# Patient Record
Sex: Male | Born: 1975
Health system: Southern US, Community
[De-identification: ages and names within clinical notes are randomized; demographics above are authoritative.]

## PROBLEM LIST (undated history)

## (undated) DIAGNOSIS — K589 Irritable bowel syndrome without diarrhea: Secondary | ICD-10-CM

## (undated) DIAGNOSIS — K219 Gastro-esophageal reflux disease without esophagitis: Secondary | ICD-10-CM

## (undated) HISTORY — PX: CHOLECYSTECTOMY: SHX55

---

## 1998-05-24 ENCOUNTER — Emergency Department (HOSPITAL_COMMUNITY): Admission: EM | Admit: 1998-05-24 | Discharge: 1998-05-24 | Payer: Self-pay | Admitting: Emergency Medicine

## 1998-05-25 ENCOUNTER — Emergency Department (HOSPITAL_COMMUNITY): Admission: EM | Admit: 1998-05-25 | Discharge: 1998-05-25 | Payer: Self-pay | Admitting: Emergency Medicine

## 1998-11-08 ENCOUNTER — Emergency Department (HOSPITAL_COMMUNITY): Admission: EM | Admit: 1998-11-08 | Discharge: 1998-11-08 | Payer: Self-pay | Admitting: Emergency Medicine

## 1998-12-16 ENCOUNTER — Emergency Department (HOSPITAL_COMMUNITY): Admission: EM | Admit: 1998-12-16 | Discharge: 1998-12-16 | Payer: Self-pay | Admitting: Emergency Medicine

## 1999-03-26 ENCOUNTER — Emergency Department (HOSPITAL_COMMUNITY): Admission: EM | Admit: 1999-03-26 | Discharge: 1999-03-26 | Payer: Self-pay | Admitting: Internal Medicine

## 1999-05-16 ENCOUNTER — Emergency Department (HOSPITAL_COMMUNITY): Admission: EM | Admit: 1999-05-16 | Discharge: 1999-05-16 | Payer: Self-pay | Admitting: Internal Medicine

## 1999-06-08 ENCOUNTER — Emergency Department (HOSPITAL_COMMUNITY): Admission: EM | Admit: 1999-06-08 | Discharge: 1999-06-08 | Payer: Self-pay | Admitting: Emergency Medicine

## 1999-06-30 ENCOUNTER — Emergency Department (HOSPITAL_COMMUNITY): Admission: EM | Admit: 1999-06-30 | Discharge: 1999-06-30 | Payer: Self-pay | Admitting: Emergency Medicine

## 1999-12-11 ENCOUNTER — Emergency Department (HOSPITAL_COMMUNITY): Admission: EM | Admit: 1999-12-11 | Discharge: 1999-12-11 | Payer: Self-pay | Admitting: Emergency Medicine

## 2006-11-05 ENCOUNTER — Emergency Department (HOSPITAL_COMMUNITY): Admission: EM | Admit: 2006-11-05 | Discharge: 2006-11-05 | Payer: Self-pay | Admitting: Emergency Medicine

## 2007-03-09 ENCOUNTER — Emergency Department (HOSPITAL_COMMUNITY): Admission: EM | Admit: 2007-03-09 | Discharge: 2007-03-09 | Payer: Self-pay | Admitting: Emergency Medicine

## 2007-04-29 ENCOUNTER — Emergency Department (HOSPITAL_COMMUNITY): Admission: EM | Admit: 2007-04-29 | Discharge: 2007-04-30 | Payer: Self-pay | Admitting: Emergency Medicine

## 2008-03-20 ENCOUNTER — Emergency Department (HOSPITAL_COMMUNITY): Admission: EM | Admit: 2008-03-20 | Discharge: 2008-03-20 | Payer: Self-pay | Admitting: Emergency Medicine

## 2009-02-24 ENCOUNTER — Emergency Department (HOSPITAL_COMMUNITY): Admission: EM | Admit: 2009-02-24 | Discharge: 2009-02-24 | Payer: Self-pay | Admitting: Emergency Medicine

## 2009-11-20 ENCOUNTER — Emergency Department (HOSPITAL_COMMUNITY): Admission: EM | Admit: 2009-11-20 | Discharge: 2009-11-20 | Payer: Self-pay | Admitting: Emergency Medicine

## 2009-12-01 ENCOUNTER — Emergency Department (HOSPITAL_COMMUNITY): Admission: EM | Admit: 2009-12-01 | Discharge: 2009-12-01 | Payer: Self-pay | Admitting: Emergency Medicine

## 2009-12-02 ENCOUNTER — Encounter: Admission: RE | Admit: 2009-12-02 | Discharge: 2009-12-02 | Payer: Self-pay | Admitting: Physician Assistant

## 2010-01-16 ENCOUNTER — Emergency Department (HOSPITAL_COMMUNITY): Admission: EM | Admit: 2010-01-16 | Discharge: 2010-01-16 | Payer: Self-pay | Admitting: Emergency Medicine

## 2010-05-06 ENCOUNTER — Emergency Department (HOSPITAL_COMMUNITY)
Admission: EM | Admit: 2010-05-06 | Discharge: 2010-05-07 | Payer: Self-pay | Source: Home / Self Care | Admitting: Emergency Medicine

## 2010-08-12 ENCOUNTER — Emergency Department (HOSPITAL_COMMUNITY)
Admission: EM | Admit: 2010-08-12 | Discharge: 2010-08-12 | Disposition: A | Discharge status: Home / Self Care | Payer: Self-pay | Attending: Emergency Medicine | Admitting: Emergency Medicine

## 2010-08-12 DIAGNOSIS — J45909 Unspecified asthma, uncomplicated: Secondary | ICD-10-CM | POA: Insufficient documentation

## 2010-08-12 DIAGNOSIS — J029 Acute pharyngitis, unspecified: Secondary | ICD-10-CM | POA: Insufficient documentation

## 2010-08-12 DIAGNOSIS — E119 Type 2 diabetes mellitus without complications: Secondary | ICD-10-CM | POA: Insufficient documentation

## 2010-08-12 DIAGNOSIS — J3489 Other specified disorders of nose and nasal sinuses: Secondary | ICD-10-CM | POA: Insufficient documentation

## 2010-09-03 LAB — RAPID STREP SCREEN (MED CTR MEBANE ONLY): Streptococcus, Group A Screen (Direct): NEGATIVE

## 2010-09-03 LAB — POCT CARDIAC MARKERS: Troponin i, poc: <0.05 ng/mL (ref 0.00–0.09)

## 2010-09-03 LAB — POCT I-STAT, CHEM 8
BUN: 9 mg/dL (ref 6–23)
Calcium, Ion: 1.09 mmol/L — ABNORMAL LOW (ref 1.12–1.32)
Glucose, Bld: 119 mg/dL — ABNORMAL HIGH (ref 70–99)
TCO2: 26 mmol/L (ref 0–100)

## 2010-09-06 LAB — POCT I-STAT, CHEM 8
BUN: 6 mg/dL (ref 6–23)
Calcium, Ion: 1.13 mmol/L (ref 1.12–1.32)
Chloride: 104 mEq/L (ref 96–112)
HCT: 43 % (ref 39.0–52.0)
Potassium: 3.5 mEq/L (ref 3.5–5.1)

## 2010-09-06 LAB — GLUCOSE, CAPILLARY: Glucose-Capillary: 178 mg/dL — ABNORMAL HIGH (ref 70–99)

## 2010-09-08 LAB — URINALYSIS, ROUTINE W REFLEX MICROSCOPIC
Bilirubin Urine: NEGATIVE
Glucose, UA: >1000 mg/dL — AB
Hgb urine dipstick: NEGATIVE
Ketones, ur: NEGATIVE mg/dL
Leukocytes, UA: NEGATIVE
Nitrite: NEGATIVE
Protein, ur: NEGATIVE mg/dL
Specific Gravity, Urine: 1.042 — ABNORMAL HIGH (ref 1.005–1.030)
Urobilinogen, UA: 1 mg/dL (ref 0.0–1.0)
pH: 5.5 (ref 5.0–8.0)

## 2010-09-08 LAB — COMPREHENSIVE METABOLIC PANEL WITH GFR
AST: 28 U/L (ref 0–37)
BUN: 5 mg/dL — ABNORMAL LOW (ref 6–23)
CO2: 26 meq/L (ref 19–32)
Chloride: 96 meq/L (ref 96–112)
Creatinine, Ser: 0.71 mg/dL (ref 0.4–1.5)
GFR calc Af Amer: >60 mL/min (ref >60)
GFR calc non Af Amer: >60 mL/min (ref >60)
Total Bilirubin: 0.8 mg/dL (ref 0.3–1.2)

## 2010-09-08 LAB — GLUCOSE, CAPILLARY
Glucose-Capillary: 365 mg/dL — ABNORMAL HIGH (ref 70–99)
Glucose-Capillary: 377 mg/dL — ABNORMAL HIGH (ref 70–99)
Glucose-Capillary: 427 mg/dL — ABNORMAL HIGH (ref 70–99)
Glucose-Capillary: 493 mg/dL — ABNORMAL HIGH (ref 70–99)

## 2010-09-08 LAB — DIFFERENTIAL
Basophils Absolute: 0 K/uL (ref 0.0–0.1)
Basophils Relative: 0 % (ref 0–1)
Eosinophils Absolute: 0.2 10*3/uL (ref 0.0–0.7)
Eosinophils Relative: 2 % (ref 0–5)
Lymphocytes Relative: 26 % (ref 12–46)
Lymphs Abs: 3.1 10*3/uL (ref 0.7–4.0)
Monocytes Absolute: 0.9 10*3/uL (ref 0.1–1.0)
Monocytes Relative: 7 % (ref 3–12)
Neutro Abs: 7.9 10*3/uL — ABNORMAL HIGH (ref 1.7–7.7)
Neutrophils Relative %: 65 % (ref 43–77)

## 2010-09-08 LAB — COMPREHENSIVE METABOLIC PANEL
ALT: 43 U/L (ref 0–53)
Albumin: 3.5 g/dL (ref 3.5–5.2)
Alkaline Phosphatase: 121 U/L — ABNORMAL HIGH (ref 39–117)
Calcium: 9.1 mg/dL (ref 8.4–10.5)
Glucose, Bld: 454 mg/dL — ABNORMAL HIGH (ref 70–99)
Potassium: 3.7 mEq/L (ref 3.5–5.1)
Sodium: 132 mEq/L — ABNORMAL LOW (ref 135–145)
Total Protein: 7.9 g/dL (ref 6.0–8.3)

## 2010-09-08 LAB — CBC
HCT: 43 % (ref 39.0–52.0)
Hemoglobin: 14.5 g/dL (ref 13.0–17.0)
MCHC: 33.7 g/dL (ref 30.0–36.0)
MCV: 87 fL (ref 78.0–100.0)
Platelets: 282 10*3/uL (ref 150–400)
RBC: 4.94 MIL/uL (ref 4.22–5.81)
RDW: 13.9 % (ref 11.5–15.5)
WBC: 12.2 K/uL — ABNORMAL HIGH (ref 4.0–10.5)

## 2010-09-08 LAB — URINE MICROSCOPIC-ADD ON: Urine-Other: NONE SEEN

## 2010-09-26 LAB — BASIC METABOLIC PANEL
BUN: 7 mg/dL (ref 6–23)
CO2: 27 mEq/L (ref 19–32)
Calcium: 9 mg/dL (ref 8.4–10.5)
Chloride: 104 mEq/L (ref 96–112)
Creatinine, Ser: 0.76 mg/dL (ref 0.4–1.5)
GFR calc Af Amer: >60 mL/min (ref >60)
GFR calc non Af Amer: >60 mL/min (ref >60)
Glucose, Bld: 113 mg/dL — ABNORMAL HIGH (ref 70–99)
Potassium: 3.9 mEq/L (ref 3.5–5.1)
Sodium: 139 mEq/L (ref 135–145)

## 2010-09-26 LAB — CBC
HCT: 37.5 % — ABNORMAL LOW (ref 39.0–52.0)
Hemoglobin: 12.4 g/dL — ABNORMAL LOW (ref 13.0–17.0)
MCV: 87.3 fL (ref 78.0–100.0)
RBC: 4.29 MIL/uL (ref 4.22–5.81)
WBC: 11.4 10*3/uL — ABNORMAL HIGH (ref 4.0–10.5)

## 2010-09-26 LAB — URINALYSIS, ROUTINE W REFLEX MICROSCOPIC
Bilirubin Urine: NEGATIVE
Glucose, UA: NEGATIVE mg/dL
Hgb urine dipstick: NEGATIVE
Ketones, ur: NEGATIVE mg/dL
Nitrite: NEGATIVE
Protein, ur: NEGATIVE mg/dL
Specific Gravity, Urine: 1.017 (ref 1.005–1.030)
Urobilinogen, UA: 0.2 mg/dL (ref 0.0–1.0)
pH: 6.5 (ref 5.0–8.0)

## 2010-09-26 LAB — DIFFERENTIAL
Basophils Absolute: 0.1 10*3/uL (ref 0.0–0.1)
Basophils Relative: 1 % (ref 0–1)
Eosinophils Absolute: 0.2 10*3/uL (ref 0.0–0.7)
Eosinophils Relative: 2 % (ref 0–5)
Lymphocytes Relative: 26 % (ref 12–46)
Lymphs Abs: 2.9 10*3/uL (ref 0.7–4.0)
Monocytes Absolute: 0.9 10*3/uL (ref 0.1–1.0)
Monocytes Relative: 8 % (ref 3–12)
Neutro Abs: 7.3 10*3/uL (ref 1.7–7.7)
Neutrophils Relative %: 64 % (ref 43–77)

## 2010-09-26 LAB — RAPID URINE DRUG SCREEN, HOSP PERFORMED
Amphetamines: NOT DETECTED
Tetrahydrocannabinol: POSITIVE — AB

## 2010-10-07 ENCOUNTER — Emergency Department (HOSPITAL_COMMUNITY)
Admission: EM | Admit: 2010-10-07 | Discharge: 2010-10-07 | Disposition: A | Discharge status: Home / Self Care | Payer: Self-pay | Attending: Emergency Medicine | Admitting: Emergency Medicine

## 2010-10-07 DIAGNOSIS — R509 Fever, unspecified: Secondary | ICD-10-CM | POA: Insufficient documentation

## 2010-10-07 DIAGNOSIS — H9209 Otalgia, unspecified ear: Secondary | ICD-10-CM | POA: Insufficient documentation

## 2010-10-07 DIAGNOSIS — R51 Headache: Secondary | ICD-10-CM | POA: Insufficient documentation

## 2010-10-07 DIAGNOSIS — J029 Acute pharyngitis, unspecified: Secondary | ICD-10-CM | POA: Insufficient documentation

## 2010-10-07 DIAGNOSIS — J3489 Other specified disorders of nose and nasal sinuses: Secondary | ICD-10-CM | POA: Insufficient documentation

## 2010-10-07 DIAGNOSIS — R609 Edema, unspecified: Secondary | ICD-10-CM | POA: Insufficient documentation

## 2010-10-07 DIAGNOSIS — J45909 Unspecified asthma, uncomplicated: Secondary | ICD-10-CM | POA: Insufficient documentation

## 2010-10-08 LAB — STREP A DNA PROBE: Group A Strep Probe: NEGATIVE

## 2011-02-23 ENCOUNTER — Emergency Department (HOSPITAL_COMMUNITY)
Admission: EM | Admit: 2011-02-23 | Discharge: 2011-02-23 | Disposition: A | Discharge status: Home / Self Care | Payer: Self-pay | Attending: Emergency Medicine | Admitting: Emergency Medicine

## 2011-02-23 DIAGNOSIS — L02219 Cutaneous abscess of trunk, unspecified: Secondary | ICD-10-CM | POA: Insufficient documentation

## 2011-02-26 LAB — WOUND CULTURE: Culture: NO GROWTH

## 2011-04-02 LAB — I-STAT 8, (EC8 V) (CONVERTED LAB)
BUN: 7
Bicarbonate: 26.6 — ABNORMAL HIGH
Glucose, Bld: 83
Operator id: 234501
TCO2: 28
pCO2, Ven: 38.2 — ABNORMAL LOW

## 2011-04-02 LAB — CBC
HCT: 40
Hemoglobin: 13.4
MCHC: 33.5
MCV: 86.8
Platelets: 295
RBC: 4.61
RDW: 13.9
WBC: 13 — ABNORMAL HIGH

## 2011-04-02 LAB — DIFFERENTIAL
Basophils Absolute: 0.1
Basophils Relative: 0
Eosinophils Absolute: 0.2
Eosinophils Relative: 2
Lymphocytes Relative: 25
Lymphs Abs: 3.2
Monocytes Absolute: 1.2 — ABNORMAL HIGH
Monocytes Relative: 9
Neutro Abs: 8.3 — ABNORMAL HIGH
Neutrophils Relative %: 64

## 2011-04-02 LAB — GC/CHLAMYDIA PROBE AMP, URINE
Chlamydia, Swab/Urine, PCR: POSITIVE — AB
GC Probe Amp, Urine: NEGATIVE

## 2011-04-02 LAB — D-DIMER, QUANTITATIVE: D-Dimer, Quant: 0.31

## 2011-04-02 LAB — POCT CARDIAC MARKERS
CKMB, poc: 1.5
Myoglobin, poc: 83.9
Troponin i, poc: <0.05

## 2011-04-02 LAB — POCT I-STAT CREATININE: Operator id: 234501

## 2011-06-02 ENCOUNTER — Emergency Department (INDEPENDENT_AMBULATORY_CARE_PROVIDER_SITE_OTHER): Payer: Self-pay

## 2011-06-02 ENCOUNTER — Other Ambulatory Visit: Payer: Self-pay

## 2011-06-02 ENCOUNTER — Emergency Department (HOSPITAL_BASED_OUTPATIENT_CLINIC_OR_DEPARTMENT_OTHER)
Admission: EM | Admit: 2011-06-02 | Discharge: 2011-06-02 | Disposition: A | Discharge status: Home / Self Care | Payer: Self-pay | Attending: Emergency Medicine | Admitting: Emergency Medicine

## 2011-06-02 ENCOUNTER — Encounter: Payer: Self-pay | Admitting: Emergency Medicine

## 2011-06-02 DIAGNOSIS — R0789 Other chest pain: Secondary | ICD-10-CM | POA: Insufficient documentation

## 2011-06-02 DIAGNOSIS — R209 Unspecified disturbances of skin sensation: Secondary | ICD-10-CM

## 2011-06-02 DIAGNOSIS — F411 Generalized anxiety disorder: Secondary | ICD-10-CM | POA: Insufficient documentation

## 2011-06-02 DIAGNOSIS — R079 Chest pain, unspecified: Secondary | ICD-10-CM

## 2011-06-02 DIAGNOSIS — J45909 Unspecified asthma, uncomplicated: Secondary | ICD-10-CM | POA: Insufficient documentation

## 2011-06-02 DIAGNOSIS — E119 Type 2 diabetes mellitus without complications: Secondary | ICD-10-CM | POA: Insufficient documentation

## 2011-06-02 LAB — TROPONIN I: Troponin I: <0.3 ng/mL (ref <0.30)

## 2011-06-02 NOTE — ED Notes (Signed)
Pt. Reports chest pain started two days ago , worsening this morning. Pt. Reports in an increase of stress.

## 2011-06-02 NOTE — ED Notes (Signed)
Pt. Reports chest pain on left chest wall radiating to left upper arm, claimed " numbness" and has been intermittently past two days but worsen today as experiencing "stress ' on family issues.

## 2011-06-02 NOTE — ED Provider Notes (Signed)
History     CSN: [id number] Arrival date & time: 06/02/2011  9:32 AM   None     Chief Complaint  Patient presents with  . Chest Pain  . Anxiety    (Consider location/radiation/quality/duration/timing/severity/associated sxs/prior treatment) HPI Complains of left anterior chest tightness onset 3 days ago after he learned that a close associate was threatening suicide. Tightness route became worse after he learned of the death of a close friend from a heart attack at age 35 two days ago. Tightness became worse once again this morning after an argument with his daughter. Discomfort is described as tight left-sided anterior nonradiating improved with deep inspirations. No shortness of breath nausea or sweatiness no other associated symptoms, other than anxiety over recent stressors He attempted to treat himself by smoking marijuana without relief 6 AM today Past Medical History  Diagnosis Date  . Diabetes mellitus   . Asthma     Past Surgical History  Procedure Date  . Cholecystectomy     No family history on file.  History  Substance Use Topics  . Smoking status: Never Smoker   . Smokeless tobacco: Not on file  . Alcohol Use: Yes     occasional   Marijuana use   Review of Systems  Constitutional: Negative.   HENT: Negative.   Respiratory: Negative.   Cardiovascular: Positive for chest pain.  Gastrointestinal: Negative.   Musculoskeletal: Negative.   Skin: Negative.   Neurological: Negative.   Hematological: Negative.   Psychiatric/Behavioral:       Anxiety    Allergies  Review of patient's allergies indicates not on file.  Home Medications  No current outpatient prescriptions on file.  BP 143/71  Pulse 102  Temp(Src) 98.4 F (36.9 C) (Oral)  Resp 18  Wt 347 lb (157.398 kg)  SpO2 100%  Physical Exam  Nursing note and vitals reviewed. Constitutional: He appears well-developed and well-nourished.  HENT:  Head: Normocephalic and atraumatic.  Eyes:  Conjunctivae are normal. Pupils are equal, round, and reactive to light.  Neck: Neck supple. No tracheal deviation present. No thyromegaly present.  Cardiovascular: Normal rate and regular rhythm.   No murmur heard. Pulmonary/Chest: Effort normal and breath sounds normal.  Abdominal: Soft. Bowel sounds are normal. He exhibits no distension. There is no tenderness.       Morbidly obese  Musculoskeletal: Normal range of motion. He exhibits no edema and no tenderness.  Neurological: He is alert. Coordination normal.  Skin: Skin is warm and dry. No rash noted.  Psychiatric: He has a normal mood and affect.    ED Course  Procedures (including critical care time)  Labs Reviewed - No data to display No results found.  Date: 06/02/2011  Rate: 90  Rhythm: normal sinus rhythm  QRS Axis: normal  Intervals: normal  ST/T Wave abnormalities: normal  Conduction Disutrbances:none  Narrative Interpretation:   Old EKG Reviewed: unchanged No change from 05/06/2000  No diagnosis found.    MDM  Strongly doubt acute coronary syndrome with atypical symptoms normal EKG and normal cardiac marker after several days of symptoms. Patient feels his symptoms are likely related to emotional stress. I agree. Plan followup at [facility]; blood pressure recheck 3 weeks Diagnosis #1 atypical chest pain #2 anxiety  #3 elevated blood pressure      Doug Sou, MD 06/02/11 1117

## 2011-07-21 ENCOUNTER — Encounter (HOSPITAL_BASED_OUTPATIENT_CLINIC_OR_DEPARTMENT_OTHER): Payer: Self-pay | Admitting: *Deleted

## 2011-07-21 ENCOUNTER — Emergency Department (HOSPITAL_BASED_OUTPATIENT_CLINIC_OR_DEPARTMENT_OTHER)
Admission: EM | Admit: 2011-07-21 | Discharge: 2011-07-21 | Disposition: A | Discharge status: Home / Self Care | Payer: Self-pay | Attending: Emergency Medicine | Admitting: Emergency Medicine

## 2011-07-21 DIAGNOSIS — R059 Cough, unspecified: Secondary | ICD-10-CM | POA: Insufficient documentation

## 2011-07-21 DIAGNOSIS — J3489 Other specified disorders of nose and nasal sinuses: Secondary | ICD-10-CM | POA: Insufficient documentation

## 2011-07-21 DIAGNOSIS — J40 Bronchitis, not specified as acute or chronic: Secondary | ICD-10-CM | POA: Insufficient documentation

## 2011-07-21 DIAGNOSIS — R05 Cough: Secondary | ICD-10-CM | POA: Insufficient documentation

## 2011-07-21 DIAGNOSIS — J029 Acute pharyngitis, unspecified: Secondary | ICD-10-CM | POA: Insufficient documentation

## 2011-07-21 DIAGNOSIS — E119 Type 2 diabetes mellitus without complications: Secondary | ICD-10-CM | POA: Insufficient documentation

## 2011-07-21 DIAGNOSIS — J45909 Unspecified asthma, uncomplicated: Secondary | ICD-10-CM | POA: Insufficient documentation

## 2011-07-21 MED ORDER — AZITHROMYCIN 250 MG PO TABS: 250.0000 mg | ORAL_TABLET | Freq: Every day | ORAL | Status: AC

## 2011-07-21 NOTE — ED Notes (Signed)
2 day history of cough  Congestion and sore throat started running a fever today hasnt taken any meds at home

## 2011-07-21 NOTE — ED Provider Notes (Signed)
History     CSN: [id number]  Arrival date & time 07/21/11  1124   First MD Initiated Contact with Patient 07/21/11 1139      Chief Complaint  Patient presents with  . Sore Throat  . Nasal Congestion  . Cough    HPI 2 day history of cough Congestion and sore throat started running a fever today hasnt taken any meds at home  Past Medical History  Diagnosis Date  . Diabetes mellitus   . Asthma     Past Surgical History  Procedure Date  . Cholecystectomy     History reviewed. No pertinent family history.  History  Substance Use Topics  . Smoking status: Never Smoker   . Smokeless tobacco: Not on file  . Alcohol Use: Yes     occasional      Review of Systems Negative except as noted in history of present illness Allergies  Fish allergy  Home Medications   Current Outpatient Rx  Name Route Sig Dispense Refill  . ACETAMINOPHEN 500 MG PO TABS Oral Take 1,000 mg by mouth every 6 (six) hours as needed. For headache     . AZITHROMYCIN 250 MG PO TABS Oral Take 1 tablet (250 mg total) by mouth daily. Take 2 capsules day one then one capsule daily until gone 6 tablet 0  . ONE-A-DAY MENS PO Oral Take 1 tablet by mouth daily.        BP 123/79  Pulse 99  Temp(Src) 99.3 F (37.4 C) (Oral)  Resp 20  SpO2 99%  Physical Exam  Nursing note and vitals reviewed. Constitutional: He is oriented to person, place, and time. He appears well-developed and well-nourished. No distress.  HENT:  Head: Normocephalic and atraumatic.  Mouth/Throat: Uvula is midline and mucous membranes are normal. Oropharyngeal exudate and posterior oropharyngeal erythema present. No tonsillar abscesses.  Eyes: Pupils are equal, round, and reactive to light.  Neck: Normal range of motion.  Cardiovascular: Normal rate and intact distal pulses.   Pulmonary/Chest: No respiratory distress.  Abdominal: Normal appearance. He exhibits no distension.  Musculoskeletal: Normal range of motion.    Lymphadenopathy:    He has cervical adenopathy.       Left cervical: Superficial cervical and deep cervical adenopathy present.    He has no axillary adenopathy.  Neurological: He is alert and oriented to person, place, and time. No cranial nerve deficit.  Skin: Skin is warm and dry. No rash noted.  Psychiatric: He has a normal mood and affect. His behavior is normal.    ED Course  Procedures (including critical care time)   Labs Reviewed  RAPID STREP SCREEN   No results found.   1. Bronchitis   2. Pharyngitis       MDM         Nelia Shi, MD 07/21/11 1213

## 2011-12-15 ENCOUNTER — Encounter (HOSPITAL_BASED_OUTPATIENT_CLINIC_OR_DEPARTMENT_OTHER): Payer: Self-pay | Admitting: Emergency Medicine

## 2011-12-15 ENCOUNTER — Emergency Department (HOSPITAL_BASED_OUTPATIENT_CLINIC_OR_DEPARTMENT_OTHER)
Admission: EM | Admit: 2011-12-15 | Discharge: 2011-12-15 | Disposition: A | Discharge status: Home / Self Care | Payer: Self-pay | Attending: Emergency Medicine | Admitting: Emergency Medicine

## 2011-12-15 DIAGNOSIS — R599 Enlarged lymph nodes, unspecified: Secondary | ICD-10-CM | POA: Insufficient documentation

## 2011-12-15 DIAGNOSIS — J329 Chronic sinusitis, unspecified: Secondary | ICD-10-CM

## 2011-12-15 DIAGNOSIS — E119 Type 2 diabetes mellitus without complications: Secondary | ICD-10-CM | POA: Insufficient documentation

## 2011-12-15 MED ORDER — IBUPROFEN 800 MG PO TABS: 800.0000 mg | ORAL_TABLET | Freq: Three times a day (TID) | ORAL | Status: AC

## 2011-12-15 MED ORDER — CEPHALEXIN 500 MG PO CAPS: 500.0000 mg | ORAL_CAPSULE | Freq: Four times a day (QID) | ORAL | Status: AC

## 2011-12-15 NOTE — ED Notes (Signed)
Patient states he thinks he has an enlarged tender lymph node on his left jaw.  Noticed pain for 2 days and now radiates into left arm.

## 2011-12-15 NOTE — ED Provider Notes (Signed)
1:04 PM  Date: 12/15/2011  Rate:93  Rhythm: normal sinus rhythm  QRS Axis: normal  Intervals: normal  ST/T Wave abnormalities: normal  Conduction Disutrbances:none  Narrative Interpretation: Normal EKG  Old EKG Reviewed: unchanged    Carleene Cooper III, MD 12/15/11 [phone]

## 2011-12-15 NOTE — ED Provider Notes (Signed)
History     CSN: [id number]  Arrival date & time 12/15/11  1239   First MD Initiated Contact with Patient 12/15/11 1405      Chief Complaint  Patient presents with  . Lymphadenopathy    (Consider location/radiation/quality/duration/timing/severity/associated sxs/prior treatment) Patient is a 36 y.o. male presenting with URI. The history is provided by the patient. No language interpreter was used.  URI The primary symptoms include ear pain and cough. Primary symptoms do not include fever. The current episode started 2 days ago. This is a new problem. The problem has been gradually worsening.  Symptoms associated with the illness include facial pain, sinus pressure and congestion.  Pt complains of swelling to left neck.  Pt also has soreness to his left shoulder.  Pt has had pain to moving  Past Medical History  Diagnosis Date  . Diabetes mellitus   . Asthma     Past Surgical History  Procedure Date  . Cholecystectomy     No family history on file.  History  Substance Use Topics  . Smoking status: Never Smoker   . Smokeless tobacco: Not on file  . Alcohol Use: Yes     occasional      Review of Systems  Constitutional: Negative for fever.  HENT: Positive for ear pain, congestion and sinus pressure.   Respiratory: Positive for cough.   All other systems reviewed and are negative.    Allergies  Fish allergy  Home Medications   Current Outpatient Rx  Name Route Sig Dispense Refill  . ACETAMINOPHEN 500 MG PO TABS Oral Take 1,000 mg by mouth every 6 (six) hours as needed. For headache     . ONE-A-DAY MENS PO Oral Take 1 tablet by mouth daily.        BP 128/74  Pulse 95  Temp 98.1 F (36.7 C) (Oral)  Resp 18  Ht 6' (1.829 m)  Wt 330 lb (149.687 kg)  BMI 44.76 kg/m2  SpO2 99%  Physical Exam  Nursing note and vitals reviewed. Constitutional: He is oriented to person, place, and time. He appears well-developed and well-nourished.  HENT:  Head:  Normocephalic and atraumatic.  Right Ear: External ear normal.  Left Ear: External ear normal.  Nose: Nose normal.  Mouth/Throat: Oropharynx is clear and moist.  Eyes: Conjunctivae and EOM are normal. Pupils are equal, round, and reactive to light.  Neck: Normal range of motion. Neck supple.  Cardiovascular: Normal rate, regular rhythm and normal heart sounds.   Pulmonary/Chest: Effort normal and breath sounds normal.  Abdominal: Soft.  Musculoskeletal: Normal range of motion.  Neurological: He is alert and oriented to person, place, and time. He has normal reflexes.  Skin: Skin is warm.  Psychiatric: He has a normal mood and affect.    ED Course  Procedures (including critical care time)  Labs Reviewed - No data to display No results found.   No diagnosis found.    MDM  Pt given rx for keflex and ibuprofen.          Lonia Skinner [place], [place] 12/15/11 1428

## 2011-12-15 NOTE — ED Provider Notes (Signed)
Medical screening examination/treatment/procedure(s) were performed by non-physician practitioner and as supervising physician I was immediately available for consultation/collaboration.   Baruch Lewers III, MD 12/15/11 1919 

## 2011-12-15 NOTE — Discharge Instructions (Signed)

## 2011-12-30 ENCOUNTER — Emergency Department (HOSPITAL_BASED_OUTPATIENT_CLINIC_OR_DEPARTMENT_OTHER)
Admission: EM | Admit: 2011-12-30 | Discharge: 2011-12-30 | Disposition: A | Discharge status: Home / Self Care | Payer: Self-pay | Attending: Emergency Medicine | Admitting: Emergency Medicine

## 2011-12-30 ENCOUNTER — Encounter (HOSPITAL_BASED_OUTPATIENT_CLINIC_OR_DEPARTMENT_OTHER): Payer: Self-pay

## 2011-12-30 ENCOUNTER — Emergency Department (HOSPITAL_BASED_OUTPATIENT_CLINIC_OR_DEPARTMENT_OTHER): Payer: Self-pay

## 2011-12-30 DIAGNOSIS — R079 Chest pain, unspecified: Secondary | ICD-10-CM | POA: Insufficient documentation

## 2011-12-30 DIAGNOSIS — R739 Hyperglycemia, unspecified: Secondary | ICD-10-CM

## 2011-12-30 DIAGNOSIS — E119 Type 2 diabetes mellitus without complications: Secondary | ICD-10-CM | POA: Insufficient documentation

## 2011-12-30 DIAGNOSIS — K219 Gastro-esophageal reflux disease without esophagitis: Secondary | ICD-10-CM | POA: Insufficient documentation

## 2011-12-30 DIAGNOSIS — J45909 Unspecified asthma, uncomplicated: Secondary | ICD-10-CM | POA: Insufficient documentation

## 2011-12-30 LAB — CBC WITH DIFFERENTIAL/PLATELET
Basophils Absolute: 0 10*3/uL (ref 0.0–0.1)
Basophils Relative: 0 % (ref 0–1)
Eosinophils Absolute: 0.3 10*3/uL (ref 0.0–0.7)
Eosinophils Relative: 2 % (ref 0–5)
Lymphs Abs: 3.8 10*3/uL (ref 0.7–4.0)
MCH: 29.1 pg (ref 26.0–34.0)
MCHC: 33.6 g/dL (ref 30.0–36.0)
MCV: 86.7 fL (ref 78.0–100.0)
Platelets: 271 10*3/uL (ref 150–400)
RDW: 14 % (ref 11.5–15.5)

## 2011-12-30 LAB — BASIC METABOLIC PANEL
Calcium: 9 mg/dL (ref 8.4–10.5)
GFR calc Af Amer: >90 mL/min (ref >90)
GFR calc non Af Amer: >90 mL/min (ref >90)
Glucose, Bld: 211 mg/dL — ABNORMAL HIGH (ref 70–99)
Sodium: 137 mEq/L (ref 135–145)

## 2011-12-30 LAB — D-DIMER, QUANTITATIVE: D-Dimer, Quant: <0.22 ug/mL-FEU (ref 0.00–0.48)

## 2011-12-30 MED ORDER — OMEPRAZOLE 20 MG PO CPDR: 20.0000 mg | DELAYED_RELEASE_CAPSULE | Freq: Every day | ORAL | Status: DC

## 2011-12-30 MED ORDER — GI COCKTAIL ~~LOC~~
30.0000 mL | Freq: Once | ORAL | Status: AC
  Administered 2011-12-30: 30 mL via ORAL
  Filled 2011-12-30: qty 30

## 2011-12-30 NOTE — ED Provider Notes (Signed)
History     CSN: [id number]  Arrival date & time 12/30/11  0003   First MD Initiated Contact with Patient 12/30/11 0043      Chief Complaint  Patient presents with  . Chest Pain    (Consider location/radiation/quality/duration/timing/severity/associated sxs/prior treatment) Patient is a 36 y.o. male presenting with chest pain. The history is provided by the patient. No language interpreter was used.  Chest Pain The chest pain began 3 - 5 hours ago. Chest pain occurs constantly. The chest pain is unchanged. The pain is associated with eating. At its most intense, the pain is at 9/10. The pain is currently at 9/10. The severity of the pain is severe. The quality of the pain is described as sharp. Chest pain is worsened by eating. Pertinent negatives for primary symptoms include no fever, no shortness of breath, no cough, no wheezing, no palpitations, no nausea, no vomiting and no altered mental status.  Pertinent negatives for associated symptoms include no diaphoresis and no near-syncope. He tried nothing for the symptoms. Risk factors include male gender and obesity.  Pertinent negatives for past medical history include no MI.  Procedure history is negative for cardiac catheterization.   Didn't eat all day and was feeling hungry and light headed and then at a large meal and had right pain that shot across to the left and then had bitter taste in throat.  Has had this brackish taste in the past.  No DOE no n/v/d.  Past Medical History  Diagnosis Date  . Diabetes mellitus   . Asthma     Past Surgical History  Procedure Date  . Cholecystectomy     History reviewed. No pertinent family history.  History  Substance Use Topics  . Smoking status: Never Smoker   . Smokeless tobacco: Not on file  . Alcohol Use: Yes     occasional      Review of Systems  Constitutional: Negative for fever and diaphoresis.  Respiratory: Negative for cough, chest tightness, shortness of breath  and wheezing.   Cardiovascular: Positive for chest pain. Negative for palpitations, leg swelling and near-syncope.  Gastrointestinal: Negative for nausea and vomiting.  Psychiatric/Behavioral: Negative for altered mental status.  All other systems reviewed and are negative.    Allergies  Fish allergy  Home Medications   Current Outpatient Rx  Name Route Sig Dispense Refill  . ACETAMINOPHEN 500 MG PO TABS Oral Take 1,000 mg by mouth every 6 (six) hours as needed. For headache     . ONE-A-DAY MENS PO Oral Take 1 tablet by mouth daily.      Marland Kitchen OMEPRAZOLE 20 MG PO CPDR Oral Take 1 capsule (20 mg total) by mouth daily. 30 capsule 0    BP 129/67  Pulse 91  Temp 97.9 F (36.6 C) (Oral)  Resp 20  SpO2 98%  Physical Exam  Constitutional: He is oriented to person, place, and time. He appears well-developed and well-nourished.  HENT:  Head: Normocephalic and atraumatic.  Mouth/Throat: Oropharynx is clear and moist.  Eyes: Conjunctivae are normal. Pupils are equal, round, and reactive to light.  Neck: Normal range of motion. Neck supple.  Cardiovascular: Normal rate and regular rhythm.   Pulmonary/Chest: Effort normal and breath sounds normal. He has no wheezes. He has no rales. He exhibits no tenderness.  Abdominal: Soft. Bowel sounds are normal. There is no tenderness. There is no rebound and no guarding.  Musculoskeletal: Normal range of motion.  Neurological: He is alert and oriented to  person, place, and time.  Skin: Skin is warm and dry.  Psychiatric: He has a normal mood and affect.    ED Course  Procedures (including critical care time)  Labs Reviewed  CBC WITH DIFFERENTIAL - Abnormal; Notable for the following:    WBC 14.3 (*)     Neutro Abs 9.3 (*)     All other components within normal limits  BASIC METABOLIC PANEL - Abnormal; Notable for the following:    Potassium 3.3 (*)     Glucose, Bld 211 (*)     All other components within normal limits  TROPONIN I    D-DIMER, QUANTITATIVE  TROPONIN I   Dg Chest 2 View  12/30/2011  *RADIOLOGY REPORT*  Clinical Data: Chest pain  CHEST - 2 VIEW  Comparison: 06/02/2011  Findings: Normal heart size.  Clear lungs.  Mild bronchitic changes.  No pneumothorax.  No pleural effusion.  IMPRESSION: Bronchitic changes.  Original Report Authenticated By: Donavan Burnet, M.D.     1. GERD (gastroesophageal reflux disease)   2. Hyperglycemia       Date: 12/30/2011  Rate: 86  Rhythm: normal sinus rhythm  QRS Axis: normal  Intervals: normal  ST/T Wave abnormalities: normal  Conduction Disutrbances: none  Narrative Interpretation: unremarkable     MDM  Symptoms consistent with GERD, as meal associated and relieved with sitting up and GI cocktail.  In the setting of several hours of ongoing pain with 2 negative troponins and negative EKG are sufficient to exclude ACS.  Patient informed that he glucose was elevated without anion gap which may be indicative of a prediabetic state and that he should abstain from sugared juices sodas sweets etc and see his doctor this week for recheck of sugar and ongoing care.  Patient stated he had eaten a large meal before coming but agrees to follow up this week with his family doctor.  Return immediately for chest pain and shortness of breath, patient verbalizes understanding and agrees to follow up        Elfego Giammarino Smitty Cords, MD 12/30/11 [phone]

## 2011-12-30 NOTE — ED Notes (Signed)
Blood sent to lab

## 2011-12-30 NOTE — ED Notes (Signed)
Pt sts on way home from school had sharp pain on left side of chest then moved to right side,felt faint at that time

## 2012-04-11 ENCOUNTER — Emergency Department (HOSPITAL_BASED_OUTPATIENT_CLINIC_OR_DEPARTMENT_OTHER): Payer: Self-pay

## 2012-04-11 ENCOUNTER — Encounter (HOSPITAL_BASED_OUTPATIENT_CLINIC_OR_DEPARTMENT_OTHER): Payer: Self-pay | Admitting: *Deleted

## 2012-04-11 ENCOUNTER — Emergency Department (HOSPITAL_BASED_OUTPATIENT_CLINIC_OR_DEPARTMENT_OTHER)
Admission: EM | Admit: 2012-04-11 | Discharge: 2012-04-11 | Disposition: A | Discharge status: Home / Self Care | Payer: Self-pay | Attending: Emergency Medicine | Admitting: Emergency Medicine

## 2012-04-11 DIAGNOSIS — J209 Acute bronchitis, unspecified: Secondary | ICD-10-CM | POA: Insufficient documentation

## 2012-04-11 DIAGNOSIS — J45909 Unspecified asthma, uncomplicated: Secondary | ICD-10-CM | POA: Insufficient documentation

## 2012-04-11 DIAGNOSIS — K219 Gastro-esophageal reflux disease without esophagitis: Secondary | ICD-10-CM | POA: Insufficient documentation

## 2012-04-11 DIAGNOSIS — F1210 Cannabis abuse, uncomplicated: Secondary | ICD-10-CM | POA: Insufficient documentation

## 2012-04-11 DIAGNOSIS — E119 Type 2 diabetes mellitus without complications: Secondary | ICD-10-CM | POA: Insufficient documentation

## 2012-04-11 HISTORY — DX: Gastro-esophageal reflux disease without esophagitis: K21.9

## 2012-04-11 LAB — URINALYSIS, ROUTINE W REFLEX MICROSCOPIC
Bilirubin Urine: NEGATIVE
Ketones, ur: NEGATIVE mg/dL
Leukocytes, UA: NEGATIVE
Nitrite: NEGATIVE
Protein, ur: NEGATIVE mg/dL

## 2012-04-11 LAB — CBC WITH DIFFERENTIAL/PLATELET
Basophils Absolute: 0 10*3/uL (ref 0.0–0.1)
Basophils Relative: 0 % (ref 0–1)
Eosinophils Absolute: 0.3 10*3/uL (ref 0.0–0.7)
Eosinophils Relative: 2 % (ref 0–5)
HCT: 40.5 % (ref 39.0–52.0)
MCH: 29.1 pg (ref 26.0–34.0)
MCHC: 33.1 g/dL (ref 30.0–36.0)
MCV: 87.9 fL (ref 78.0–100.0)
Monocytes Absolute: 1.2 10*3/uL — ABNORMAL HIGH (ref 0.1–1.0)
Neutro Abs: 7.5 10*3/uL (ref 1.7–7.7)
RDW: 14.1 % (ref 11.5–15.5)

## 2012-04-11 LAB — BASIC METABOLIC PANEL
Calcium: 9.1 mg/dL (ref 8.4–10.5)
Creatinine, Ser: 0.8 mg/dL (ref 0.50–1.35)
GFR calc Af Amer: >90 mL/min (ref >90)
GFR calc non Af Amer: >90 mL/min (ref >90)

## 2012-04-11 MED ORDER — AMOXICILLIN 500 MG PO CAPS: 500.0000 mg | ORAL_CAPSULE | Freq: Three times a day (TID) | ORAL | Status: DC

## 2012-04-11 NOTE — ED Provider Notes (Signed)
History   This chart was scribed for Carleene Cooper III, MD by Sofie Rower. The patient was seen in room MH10/MH10 and the patient's care was started at 3:24PM.     CSN: [id number]  Arrival date & time 04/11/12  1505   First MD Initiated Contact with Patient 04/11/12 1524      Chief Complaint  Patient presents with  . Chest Pain    (Consider location/radiation/quality/duration/timing/severity/associated sxs/prior treatment) Patient is a 36 y.o. male presenting with chest pain. The history is provided by the patient. No language interpreter was used.  Chest Pain The chest pain began 3 - 5 days ago. Duration of episode(s) is 5 minutes. Chest pain occurs intermittently. The chest pain is worsening. The pain is associated with eating. At its most intense, the pain is at 7/10. The severity of the pain is moderate. The quality of the pain is described as sharp. The pain radiates to the right arm. Chest pain is worsened by eating. Primary symptoms include cough. Pertinent negatives for primary symptoms include no fever and no vomiting.  The cough began 3 to 5 days ago. The cough is new. The cough is productive. The sputum is clear. He tried aspirin for the symptoms.  His past medical history is significant for diabetes.     Benjamin Irwin is a 36 y.o. male , with a hx of GERD, who presents to the Emergency Department complaining of intermittent, progressively worsening, chest pain located at the center of the chest, radiating towards the right upper extemtiy, onset three days ago (04/08/12). Associated symptoms include light headedness, earache, productive cough and sore throat. The pt describes his chest pain as sharp, informing that when the chest pain comes on, it lasts for 5 minutes in duration, and he feels as if he needs to belch. In addition, the pt rates the chest pain at a 7/10 at worst. Modifying factors include taking Prilosec and aspirin which provides moderate relief of the chest  pain. The pt has a hx of cholecystectomy (performed in 1999) , familial hx of lupus (sister), and GI problems (brother and male family members).   The pt denies fever, vomiting, diarrhea, difficulty urinating, rash, fainting spells, and seizure activity. In addition, the pt denies any allergies to pencecillin.   The pt does not smoke, however, he does drink alcohol on occasion.   PCP is Dr. Kathleen Argue.    Past Medical History  Diagnosis Date  . Diabetes mellitus   . Asthma   . GERD (gastroesophageal reflux disease)     Past Surgical History  Procedure Date  . Cholecystectomy     History reviewed. No pertinent family history.  History  Substance Use Topics  . Smoking status: Never Smoker   . Smokeless tobacco: Not on file  . Alcohol Use: No     occasional      Review of Systems  Constitutional: Negative for fever.  Respiratory: Positive for cough.   Cardiovascular: Positive for chest pain.  Gastrointestinal: Negative for vomiting.  All other systems reviewed and are negative.    Allergies  Fish allergy and Shellfish allergy  Home Medications   Current Outpatient Rx  Name Route Sig Dispense Refill  . ACETAMINOPHEN 500 MG PO TABS Oral Take 1,000 mg by mouth every 6 (six) hours as needed. For headache     . ONE-A-DAY MENS PO Oral Take 1 tablet by mouth daily.      Marland Kitchen OMEPRAZOLE 20 MG PO CPDR Oral Take 1 capsule (  20 mg total) by mouth daily. 30 capsule 0    BP 130/81  Pulse 96  Temp 98.8 F (37.1 C) (Oral)  Resp 16  Ht 6' (1.829 m)  Wt 330 lb (149.687 kg)  BMI 44.76 kg/m2  SpO2 100%  Physical Exam  Nursing note and vitals reviewed. Constitutional: He is oriented to person, place, and time. He appears well-developed and well-nourished.       Pt is morbidly obese.   HENT:  Head: Atraumatic.  Nose: Nose normal.  Mouth/Throat: Oropharynx is clear and moist.  Eyes: Conjunctivae normal and EOM are normal. Pupils are equal, round, and reactive to light.   Neck: Normal range of motion.  Cardiovascular: Normal rate, regular rhythm and normal heart sounds.   Pulmonary/Chest: Effort normal.       Rhonchi detected over the left lung fields.   Abdominal: Soft. Bowel sounds are normal. There is tenderness (Epigastric. ).  Musculoskeletal: Normal range of motion. He exhibits no edema.  Neurological: He is alert and oriented to person, place, and time.       Pt is neurovascularly intact.   Skin: Skin is warm and dry.  Psychiatric: He has a normal mood and affect. His behavior is normal.    ED Course  Procedures (including critical care time)  DIAGNOSTIC STUDIES: Oxygen Saturation is 100% on room air, normal by my interpretation.    COORDINATION OF CARE:   4:01 PM- Treatment plan concerning EKG, blood work, and chest x-ray discussed with patient. Pt agrees with treatment.   5:44 PM- Treatment plan concerning laboratory results and management of bronchitis discussed with patient. Pt agrees with treatment.     3:21 PM  Date: 04/11/2012  Rate: 103  Rhythm: sinus tachycardia  QRS Axis: normal  Intervals: normal  ST/T Wave abnormalities: nonspecific ST changes  Conduction Disutrbances:none  Narrative Interpretation: Abnormal EKG  Old EKG Reviewed: unchanged    Results for orders placed during the hospital encounter of 04/11/12  CBC WITH DIFFERENTIAL      Component Value Range   WBC 12.7 (*) 4.0 - 10.5 K/uL   RBC 4.61  4.22 - 5.81 MIL/uL   Hemoglobin 13.4  13.0 - 17.0 g/dL   HCT 16.1  09.6 - 04.5 %   MCV 87.9  78.0 - 100.0 fL   MCH 29.1  26.0 - 34.0 pg   MCHC 33.1  30.0 - 36.0 g/dL   RDW 40.9  81.1 - 91.4 %   Platelets 279  150 - 400 K/uL   Neutrophils Relative 59  43 - 77 %   Neutro Abs 7.5  1.7 - 7.7 K/uL   Lymphocytes Relative 29  12 - 46 %   Lymphs Abs 3.7  0.7 - 4.0 K/uL   Monocytes Relative 10  3 - 12 %   Monocytes Absolute 1.2 (*) 0.1 - 1.0 K/uL   Eosinophils Relative 2  0 - 5 %   Eosinophils Absolute 0.3  0.0 - 0.7  K/uL   Basophils Relative 0  0 - 1 %   Basophils Absolute 0.0  0.0 - 0.1 K/uL  BASIC METABOLIC PANEL      Component Value Range   Sodium 139  135 - 145 mEq/L   Potassium 3.8  3.5 - 5.1 mEq/L   Chloride 102  96 - 112 mEq/L   CO2 27  19 - 32 mEq/L   Glucose, Bld 101 (*) 70 - 99 mg/dL   BUN 8  6 - 23 mg/dL  Creatinine, Ser 0.80  0.50 - 1.35 mg/dL   Calcium 9.1  8.4 - 16.1 mg/dL   GFR calc non Af Amer >90  >90 mL/min   GFR calc Af Amer >90  >90 mL/min  TROPONIN I      Component Value Range   Troponin I <0.30  <0.30 ng/mL   Dg Chest 2 View  04/11/2012  *RADIOLOGY REPORT*  Clinical Data: Chest pain, epigastric pain, asthma, diabetes, cough  CHEST - 2 VIEW  Comparison: 12/30/2011  Findings: Normal heart size, mediastinal contours, and pulmonary vascularity. Mild peribronchial thickening. No pulmonary infiltrate, pleural effusion, or pneumothorax. Irwin unremarkable.  IMPRESSION: Mild bronchitic changes.   Original Report Authenticated By: Lollie Marrow, M.D.    5:47 PM No evidence of ACS.  Will Rx for bronchitis with amoxicillin.  Also, advised to continue Prilosec, and take antacid medicine like Mylanta after meals and at bed time.   1. Acute bronchitis   2. GERD (gastroesophageal reflux disease)      I personally performed the services described in this documentation, which was scribed in my presence. The recorded information has been reviewed and considered.  Osvaldo Human, MD      Carleene Cooper III, MD 04/11/12 [phone]

## 2012-04-11 NOTE — ED Notes (Signed)
Pt. Checked his blood sugar 2 wks ago and it was 102.  Pt. Reports he has started exercising and trying to eat better.

## 2012-04-11 NOTE — ED Notes (Signed)
Pt c/o upper abd pain and chest pain also c/o nausea

## 2012-04-11 NOTE — ED Notes (Signed)
Pt. Reports he had pain across his upper abd. Just under the L and R breast area today at approx. 1pm

## 2012-04-11 NOTE — ED Notes (Signed)
Pt. Has lost 30-35 lbs in the last 2 yrs.

## 2012-04-16 ENCOUNTER — Emergency Department: Payer: Self-pay | Admitting: Emergency Medicine

## 2012-04-16 LAB — COMPREHENSIVE METABOLIC PANEL
Albumin: 3.3 g/dL — ABNORMAL LOW (ref 3.4–5.0)
Anion Gap: 10 (ref 7–16)
BUN: 10 mg/dL (ref 7–18)
Bilirubin,Total: 0.2 mg/dL (ref 0.2–1.0)
Chloride: 107 mmol/L (ref 98–107)
Creatinine: 0.95 mg/dL (ref 0.60–1.30)
EGFR (African American): >60
EGFR (Non-African Amer.): >60
Glucose: 161 mg/dL — ABNORMAL HIGH (ref 65–99)
Potassium: 3.8 mmol/L (ref 3.5–5.1)
SGOT(AST): 23 U/L (ref 15–37)
SGPT (ALT): 27 U/L (ref 12–78)
Total Protein: 7.6 g/dL (ref 6.4–8.2)

## 2012-04-16 LAB — CK TOTAL AND CKMB (NOT AT ARMC)
CK, Total: 159 U/L (ref 35–232)
CK-MB: 1.5 ng/mL (ref 0.5–3.6)

## 2012-04-16 LAB — CBC
Platelet: 303 10*3/uL (ref 150–440)
RBC: 4.83 10*6/uL (ref 4.40–5.90)
WBC: 12.5 10*3/uL — ABNORMAL HIGH (ref 3.8–10.6)

## 2012-04-16 LAB — TROPONIN I: Troponin-I: <0.02 ng/mL

## 2012-06-04 ENCOUNTER — Emergency Department (HOSPITAL_BASED_OUTPATIENT_CLINIC_OR_DEPARTMENT_OTHER)
Admission: EM | Admit: 2012-06-04 | Discharge: 2012-06-05 | Disposition: A | Discharge status: Home / Self Care | Payer: Self-pay | Attending: Emergency Medicine | Admitting: Emergency Medicine

## 2012-06-04 ENCOUNTER — Encounter (HOSPITAL_BASED_OUTPATIENT_CLINIC_OR_DEPARTMENT_OTHER): Payer: Self-pay | Admitting: *Deleted

## 2012-06-04 DIAGNOSIS — Z79899 Other long term (current) drug therapy: Secondary | ICD-10-CM | POA: Insufficient documentation

## 2012-06-04 DIAGNOSIS — K219 Gastro-esophageal reflux disease without esophagitis: Secondary | ICD-10-CM | POA: Insufficient documentation

## 2012-06-04 DIAGNOSIS — Z9089 Acquired absence of other organs: Secondary | ICD-10-CM | POA: Insufficient documentation

## 2012-06-04 DIAGNOSIS — J45909 Unspecified asthma, uncomplicated: Secondary | ICD-10-CM | POA: Insufficient documentation

## 2012-06-04 DIAGNOSIS — R05 Cough: Secondary | ICD-10-CM | POA: Insufficient documentation

## 2012-06-04 DIAGNOSIS — R059 Cough, unspecified: Secondary | ICD-10-CM | POA: Insufficient documentation

## 2012-06-04 DIAGNOSIS — R0982 Postnasal drip: Secondary | ICD-10-CM | POA: Insufficient documentation

## 2012-06-04 DIAGNOSIS — E119 Type 2 diabetes mellitus without complications: Secondary | ICD-10-CM | POA: Insufficient documentation

## 2012-06-04 DIAGNOSIS — Z91013 Allergy to seafood: Secondary | ICD-10-CM | POA: Insufficient documentation

## 2012-06-04 DIAGNOSIS — R040 Epistaxis: Secondary | ICD-10-CM | POA: Insufficient documentation

## 2012-06-04 NOTE — ED Provider Notes (Signed)
History    This chart was scribed for Benjamin Irwin Smitty Cords, MD, MD by Smitty Pluck, ED Scribe. The patient was seen in room MH09/MH09 and the patient's care was started at 11:34 PM .   CSN: [id number]  Arrival date & time 06/04/12  2320   None     Chief Complaint  Patient presents with  . Cough    (Consider location/radiation/quality/duration/timing/severity/associated sxs/prior treatment) Patient is a 36 y.o. male presenting with pharyngitis. The history is provided by the patient. No language interpreter was used.  Sore Throat This is a new problem. The current episode started 6 to 12 hours ago. The problem occurs constantly. The problem has not changed since onset.Pertinent negatives include no chest pain, no abdominal pain, no headaches and no shortness of breath. Nothing aggravates the symptoms. He has tried nothing for the symptoms. The treatment provided no relief.   Benjamin Irwin is a 36 y.o. male who presents to the Emergency Department complaining of constant, moderate sore throat onset today and nasal congestion onset 1 day ago. Pt reports that he has been having nasal drainage down the back of the throat and then coughing up nasal drainage and noticed blood in the post nasal drip. He denies recent surgeries, long travel, fevers, chills, nausea, vomiting, diarrhea and any other pain.   Past Medical History  Diagnosis Date  . Diabetes mellitus   . Asthma   . GERD (gastroesophageal reflux disease)     Past Surgical History  Procedure Date  . Cholecystectomy     History reviewed. No pertinent family history.  History  Substance Use Topics  . Smoking status: Never Smoker   . Smokeless tobacco: Not on file  . Alcohol Use: No     Comment: occasional      Review of Systems  Constitutional: Negative for fever and chills.  HENT: Positive for congestion and sore throat.   Respiratory: Negative for shortness of breath.   Cardiovascular: Negative for chest  pain.  Gastrointestinal: Negative for nausea, vomiting, abdominal pain and diarrhea.  Skin: Negative for rash.  Neurological: Negative for headaches.  All other systems reviewed and are negative.    Allergies  Fish allergy and Shellfish allergy  Home Medications   Current Outpatient Rx  Name  Route  Sig  Dispense  Refill  . ACETAMINOPHEN 500 MG PO TABS   Oral   Take 1,000 mg by mouth every 6 (six) hours as needed. For headache          . AMOXICILLIN 500 MG PO CAPS   Oral   Take 1 capsule (500 mg total) by mouth 3 (three) times daily.   21 capsule   0   . ONE-A-DAY MENS PO   Oral   Take 1 tablet by mouth daily.           Marland Kitchen OMEPRAZOLE 20 MG PO CPDR   Oral   Take 1 capsule (20 mg total) by mouth daily.   30 capsule   0     BP 139/85  Pulse 92  Temp 98.3 F (36.8 C) (Oral)  Resp 20  Ht 6' (1.829 m)  Wt 325 lb (147.419 kg)  BMI 44.08 kg/m2  SpO2 95%  Physical Exam  Nursing note and vitals reviewed. Constitutional: He is oriented to person, place, and time. He appears well-developed and well-nourished. No distress.  HENT:  Head: Normocephalic and atraumatic.  Right Ear: External ear normal.  Left Ear: External ear normal.  Post nasal drip  With posterior oropharyngeal cobblestoning Evidence of bleeding from right kisselbach's plexus now hemostatic    Eyes: Pupils are equal, round, and reactive to light.  Neck: Normal range of motion. Neck supple.  Cardiovascular: Normal rate, regular rhythm and normal heart sounds.   Pulmonary/Chest: Effort normal and breath sounds normal. No respiratory distress. He has no wheezes. He has no rales.  Abdominal: Soft. He exhibits no distension. There is no tenderness.  Musculoskeletal: Normal range of motion. He exhibits no tenderness.  Lymphadenopathy:    He has no cervical adenopathy.  Neurological: He is alert and oriented to person, place, and time.  Skin: Skin is warm and dry. No rash noted.  Psychiatric: He  has a normal mood and affect. His behavior is normal.    ED Course  Procedures (including critical care time) DIAGNOSTIC STUDIES: Oxygen Saturation is 95% on room air, adequate by my interpretation.    COORDINATION OF CARE: 11:40 PM Discussed ED treatment with pt      Labs Reviewed  RAPID STREP SCREEN   No results found.   No diagnosis found.    MDM  Post nasal drip with evidence of earlier nose bleed.  PERC and wells negative.  Will treat for PND.        I personally performed the services described in this documentation, which was scribed in my presence. The recorded information has been reviewed and is accurate.    Jasmine Awe, MD 06/05/12 [phone]

## 2012-06-04 NOTE — ED Notes (Signed)
Pt states he noticed some bloody mucous yesterday and his nasal passages felt dry. He also c/o sore throat and dry cough tonight.

## 2012-06-05 MED ORDER — IBUPROFEN 800 MG PO TABS
800.0000 mg | ORAL_TABLET | Freq: Once | ORAL | Status: AC
  Administered 2012-06-05: 800 mg via ORAL
  Filled 2012-06-05: qty 1

## 2012-06-05 MED ORDER — IBUPROFEN 600 MG PO TABS: 600.0000 mg | ORAL_TABLET | Freq: Four times a day (QID) | ORAL | Status: DC | PRN

## 2012-06-05 MED ORDER — FLUTICASONE PROPIONATE 50 MCG/ACT NA SUSP: 2.0000 | Freq: Every day | NASAL | Status: DC

## 2012-07-18 ENCOUNTER — Encounter (HOSPITAL_BASED_OUTPATIENT_CLINIC_OR_DEPARTMENT_OTHER): Payer: Self-pay

## 2012-07-18 ENCOUNTER — Encounter (HOSPITAL_BASED_OUTPATIENT_CLINIC_OR_DEPARTMENT_OTHER): Payer: Self-pay | Admitting: *Deleted

## 2012-07-18 ENCOUNTER — Emergency Department (HOSPITAL_BASED_OUTPATIENT_CLINIC_OR_DEPARTMENT_OTHER)
Admission: EM | Admit: 2012-07-18 | Discharge: 2012-07-18 | Discharge status: Against Medical Advice | Payer: Self-pay | Attending: Emergency Medicine | Admitting: Emergency Medicine

## 2012-07-18 ENCOUNTER — Emergency Department (HOSPITAL_BASED_OUTPATIENT_CLINIC_OR_DEPARTMENT_OTHER)
Admission: EM | Admit: 2012-07-18 | Discharge: 2012-07-18 | Disposition: A | Discharge status: Home / Self Care | Payer: Self-pay | Attending: Emergency Medicine | Admitting: Emergency Medicine

## 2012-07-18 DIAGNOSIS — R51 Headache: Secondary | ICD-10-CM | POA: Insufficient documentation

## 2012-07-18 DIAGNOSIS — IMO0001 Reserved for inherently not codable concepts without codable children: Secondary | ICD-10-CM | POA: Insufficient documentation

## 2012-07-18 DIAGNOSIS — E119 Type 2 diabetes mellitus without complications: Secondary | ICD-10-CM | POA: Insufficient documentation

## 2012-07-18 DIAGNOSIS — R6883 Chills (without fever): Secondary | ICD-10-CM | POA: Insufficient documentation

## 2012-07-18 DIAGNOSIS — J3489 Other specified disorders of nose and nasal sinuses: Secondary | ICD-10-CM | POA: Insufficient documentation

## 2012-07-18 DIAGNOSIS — K219 Gastro-esophageal reflux disease without esophagitis: Secondary | ICD-10-CM | POA: Insufficient documentation

## 2012-07-18 DIAGNOSIS — J45909 Unspecified asthma, uncomplicated: Secondary | ICD-10-CM | POA: Insufficient documentation

## 2012-07-18 DIAGNOSIS — Z79899 Other long term (current) drug therapy: Secondary | ICD-10-CM | POA: Insufficient documentation

## 2012-07-18 NOTE — ED Notes (Signed)
Pt amb to room 11 with quick steady gait in nad. Pt states he felt chilly last night, checked his temp and it was 96 - 97. Pt states he usually runs 98.6, "I'm very hot natured, so for me to be cold is unusual" pt states he also has muscle aches in his right arm.

## 2012-07-18 NOTE — ED Provider Notes (Signed)
History     CSN: [id number]  Arrival date & time 07/18/12  4098   First MD Initiated Contact with Patient 07/18/12 [phone]      Chief Complaint  Patient presents with  . Chills    (Consider location/radiation/quality/duration/timing/severity/associated sxs/prior treatment) HPI Patient complaining of chills began last night, states temperature last night 96.1, states usually a hot natured person, but "I was just cold."  Now warm, but felt chills on way here.  Came in last night but so packed he went home.  States has diabetes but controlled with diet and exercise.  He hasn;t checked his bs for three weeks it was "not anything to cause concern".  Patient states he has nasal congestion , slight headache, no cough, or sore throat, nausea, but no vomiting, or diarrhea, no fever, mild increase in frequency of urination.  PMD Dr. Bruna Potter.  Past Medical History  Diagnosis Date  . Diabetes mellitus   . Asthma   . GERD (gastroesophageal reflux disease)     Past Surgical History  Procedure Date  . Cholecystectomy     No family history on file.  History  Substance Use Topics  . Smoking status: Never Smoker   . Smokeless tobacco: Not on file  . Alcohol Use: No     Comment: occasional      Review of Systems  All other systems reviewed and are negative.    Allergies  Fish allergy and Shellfish allergy  Home Medications   Current Outpatient Rx  Name  Route  Sig  Dispense  Refill  . ACETAMINOPHEN 500 MG PO TABS   Oral   Take 1,000 mg by mouth every 6 (six) hours as needed. For headache          . FLUTICASONE PROPIONATE 50 MCG/ACT NA SUSP   Nasal   Place 2 sprays into the nose daily.   16 g   0   . IBUPROFEN 600 MG PO TABS   Oral   Take 1 tablet (600 mg total) by mouth every 6 (six) hours as needed for pain.   30 tablet   0   . ONE-A-DAY MENS PO   Oral   Take 1 tablet by mouth daily.           Marland Kitchen OMEPRAZOLE 20 MG PO CPDR   Oral   Take 1 capsule (20 mg total) by  mouth daily.   30 capsule   0     BP 152/81  Pulse 94  Temp 98.6 F (37 C) (Oral)  Resp 18  Ht 6' (1.829 m)  Wt 325 lb (147.419 kg)  BMI 44.08 kg/m2  SpO2 99%  Physical Exam  Vitals reviewed. Constitutional: He is oriented to person, place, and time. He appears well-developed and well-nourished.       Morbidly obese  HENT:  Head: Normocephalic and atraumatic.  Right Ear: External ear normal.  Left Ear: External ear normal.  Nose: Nose normal.  Mouth/Throat: Oropharynx is clear and moist.  Eyes: Conjunctivae normal and EOM are normal. Pupils are equal, round, and reactive to light.  Neck: Normal range of motion. Neck supple.  Cardiovascular: Normal rate, regular rhythm, normal heart sounds and intact distal pulses.   Pulmonary/Chest: Effort normal and breath sounds normal.  Abdominal: Soft. Bowel sounds are normal.  Musculoskeletal: Normal range of motion.  Neurological: He is alert and oriented to person, place, and time. He has normal reflexes.  Skin: Skin is warm and dry.  Psychiatric: He has  a normal mood and affect. His behavior is normal. Judgment and thought content normal.    ED Course  Procedures (including critical care time)  Labs Reviewed - No data to display No results found.   No diagnosis found.    MDM  bs 129 here.  Chills with no other complaints and normal body temperature.  Patient advised to return if worse, ow follow up with pmd.       Hilario Quarry, MD 07/18/12 [phone]

## 2012-07-18 NOTE — ED Notes (Signed)
Patient reports that he developed chills last pm with general muscle aching for same. Denies cough, denies sore throat. No acute distress

## 2012-12-10 ENCOUNTER — Emergency Department (HOSPITAL_BASED_OUTPATIENT_CLINIC_OR_DEPARTMENT_OTHER): Payer: Self-pay

## 2012-12-10 ENCOUNTER — Encounter (HOSPITAL_BASED_OUTPATIENT_CLINIC_OR_DEPARTMENT_OTHER): Payer: Self-pay | Admitting: *Deleted

## 2012-12-10 ENCOUNTER — Emergency Department (HOSPITAL_BASED_OUTPATIENT_CLINIC_OR_DEPARTMENT_OTHER)
Admission: EM | Admit: 2012-12-10 | Discharge: 2012-12-10 | Disposition: A | Discharge status: Home / Self Care | Payer: Self-pay | Attending: Emergency Medicine | Admitting: Emergency Medicine

## 2012-12-10 DIAGNOSIS — E119 Type 2 diabetes mellitus without complications: Secondary | ICD-10-CM | POA: Insufficient documentation

## 2012-12-10 DIAGNOSIS — Z79899 Other long term (current) drug therapy: Secondary | ICD-10-CM | POA: Insufficient documentation

## 2012-12-10 DIAGNOSIS — R000 Tachycardia, unspecified: Secondary | ICD-10-CM | POA: Insufficient documentation

## 2012-12-10 DIAGNOSIS — X500XXA Overexertion from strenuous movement or load, initial encounter: Secondary | ICD-10-CM | POA: Insufficient documentation

## 2012-12-10 DIAGNOSIS — J45909 Unspecified asthma, uncomplicated: Secondary | ICD-10-CM | POA: Insufficient documentation

## 2012-12-10 DIAGNOSIS — Y929 Unspecified place or not applicable: Secondary | ICD-10-CM | POA: Insufficient documentation

## 2012-12-10 DIAGNOSIS — S93409A Sprain of unspecified ligament of unspecified ankle, initial encounter: Secondary | ICD-10-CM | POA: Insufficient documentation

## 2012-12-10 DIAGNOSIS — K219 Gastro-esophageal reflux disease without esophagitis: Secondary | ICD-10-CM | POA: Insufficient documentation

## 2012-12-10 DIAGNOSIS — Y939 Activity, unspecified: Secondary | ICD-10-CM | POA: Insufficient documentation

## 2012-12-10 MED ORDER — NAPROXEN 500 MG PO TABS: 500.0000 mg | ORAL_TABLET | Freq: Two times a day (BID) | ORAL | Status: DC

## 2012-12-10 NOTE — ED Notes (Signed)
Pt states he twisted his right ankle this a.m. C/O pain to same. Ambulatory with limp.

## 2012-12-10 NOTE — ED Provider Notes (Signed)
History     CSN: [id number]  Arrival date & time 12/10/12  1617   First MD Initiated Contact with Patient 12/10/12 1652      Chief Complaint  Patient presents with  . Ankle Injury    (Consider location/radiation/quality/duration/timing/severity/associated sxs/prior treatment) HPI Benjamin Irwin is a 37 y.o. male who presents to the ED with right ankle pain. The pain started this morning after he twisted his ankle. The pain is located on the medial and lateral aspect of the ankle. He denies any other injuries.   Past Medical History  Diagnosis Date  . Diabetes mellitus   . Asthma   . GERD (gastroesophageal reflux disease)     Past Surgical History  Procedure Laterality Date  . Cholecystectomy      History reviewed. No pertinent family history.  History  Substance Use Topics  . Smoking status: Never Smoker   . Smokeless tobacco: Not on file  . Alcohol Use: No     Comment: occasional      Review of Systems  Constitutional: Negative for fever and chills.  HENT: Negative for neck pain.   Respiratory: Negative for shortness of breath.   Gastrointestinal: Negative for nausea and vomiting.  Musculoskeletal:       Right foot and ankle pain  Skin: Negative for wound.  Neurological: Negative for headaches.  Psychiatric/Behavioral: The patient is not nervous/anxious.     Allergies  Fish allergy and Shellfish allergy  Home Medications   Current Outpatient Rx  Name  Route  Sig  Dispense  Refill  . acetaminophen (TYLENOL) 500 MG tablet   Oral   Take 1,000 mg by mouth every 6 (six) hours as needed. For headache          . fluticasone (FLONASE) 50 MCG/ACT nasal spray   Nasal   Place 2 sprays into the nose daily.   16 g   0   . ibuprofen (ADVIL,MOTRIN) 600 MG tablet   Oral   Take 1 tablet (600 mg total) by mouth every 6 (six) hours as needed for pain.   30 tablet   0   . Multiple Vitamin (ONE-A-DAY MENS PO)   Oral   Take 1 tablet by mouth daily.            Marland Kitchen omeprazole (PRILOSEC) 20 MG capsule   Oral   Take 1 capsule (20 mg total) by mouth daily.   30 capsule   0     BP 151/86  Pulse 110  Temp(Src) 98.8 F (37.1 C) (Oral)  Resp 20  Ht 6' (1.829 m)  Wt 365 lb (165.563 kg)  BMI 49.49 kg/m2  SpO2 97%  Physical Exam  Nursing note and vitals reviewed. Constitutional: He is oriented to person, place, and time. He appears well-developed and well-nourished. No distress.  HENT:  Head: Normocephalic.  Eyes: EOM are normal.  Neck: Neck supple.  Cardiovascular: Tachycardia present.   Pulmonary/Chest: Effort normal.  Musculoskeletal:       Right ankle: He exhibits normal range of motion, no deformity, no laceration and normal pulse. Swelling: minimal. Tenderness. Lateral malleolus and medial malleolus tenderness found. Achilles tendon normal.  Neurological: He is alert and oriented to person, place, and time. No cranial nerve deficit.  Skin: Skin is warm and dry.  Psychiatric: He has a normal mood and affect.    ED Course  Procedures (including critical care time) Dg Ankle Complete Right  12/10/2012   *RADIOLOGY REPORT*  Clinical Data: Right ankle pain.  RIGHT ANKLE - COMPLETE 3+ VIEW  Comparison: None  Findings: The ankle mortise is maintained.  No acute ankle fracture or osteochondral abnormality.  The visualized mid and hind foot bony structures are intact.  The  IMPRESSION: No acute bony findings.   Original Report Authenticated By: Rudie Meyer, M.D.   Dg Foot Complete Right  12/10/2012   *RADIOLOGY REPORT*  Clinical Data: Injured right foot.  RIGHT FOOT COMPLETE - 3+ VIEW  Comparison: None  Findings: The joint spaces are maintained.  Mild degenerative changes at the first metatarsal phalangeal joint.  No acute fracture.  IMPRESSION: No acute bony findings.   Original Report Authenticated By: Rudie Meyer, M.D.    MDM  37 y.o. male with ankle sprain. Will treat with ASO and ibuprofen. Patient will apply ice elevate and  follow up with ortho as needed.  I have reviewed this patient's vital signs, nurses notes, imaging and discussed clinical and x-ray findings with the patient and plan of care. Patient voices understanding.    Medication List    TAKE these medications       naproxen 500 MG tablet  Commonly known as:  NAPROSYN  Take 1 tablet (500 mg total) by mouth 2 (two) times daily.      ASK your doctor about these medications       acetaminophen 500 MG tablet  Commonly known as:  TYLENOL  Take 1,000 mg by mouth every 6 (six) hours as needed. For headache     fluticasone 50 MCG/ACT nasal spray  Commonly known as:  FLONASE  Place 2 sprays into the nose daily.     ibuprofen 600 MG tablet  Commonly known as:  ADVIL,MOTRIN  Take 1 tablet (600 mg total) by mouth every 6 (six) hours as needed for pain.     omeprazole 20 MG capsule  Commonly known as:  PRILOSEC  Take 1 capsule (20 mg total) by mouth daily.     ONE-A-DAY MENS PO  Take 1 tablet by mouth daily.               [address] [place], [place] 12/11/12 [phone]

## 2012-12-10 NOTE — ED Notes (Signed)
ASO applied.  CMS intact pre and post application.

## 2012-12-11 NOTE — ED Provider Notes (Signed)
Medical screening examination/treatment/procedure(s) were performed by non-physician practitioner and as supervising physician I was immediately available for consultation/collaboration.   Shontia Gillooly III, MD 12/11/12 1322 

## 2012-12-21 ENCOUNTER — Emergency Department (HOSPITAL_BASED_OUTPATIENT_CLINIC_OR_DEPARTMENT_OTHER)
Admission: EM | Admit: 2012-12-21 | Discharge: 2012-12-21 | Disposition: A | Discharge status: Home / Self Care | Payer: Self-pay | Attending: Emergency Medicine | Admitting: Emergency Medicine

## 2012-12-21 ENCOUNTER — Encounter (HOSPITAL_BASED_OUTPATIENT_CLINIC_OR_DEPARTMENT_OTHER): Payer: Self-pay

## 2012-12-21 DIAGNOSIS — Z8719 Personal history of other diseases of the digestive system: Secondary | ICD-10-CM | POA: Insufficient documentation

## 2012-12-21 DIAGNOSIS — J45909 Unspecified asthma, uncomplicated: Secondary | ICD-10-CM | POA: Insufficient documentation

## 2012-12-21 DIAGNOSIS — E119 Type 2 diabetes mellitus without complications: Secondary | ICD-10-CM | POA: Insufficient documentation

## 2012-12-21 DIAGNOSIS — Z202 Contact with and (suspected) exposure to infections with a predominantly sexual mode of transmission: Secondary | ICD-10-CM

## 2012-12-21 DIAGNOSIS — Z113 Encounter for screening for infections with a predominantly sexual mode of transmission: Secondary | ICD-10-CM | POA: Insufficient documentation

## 2012-12-21 LAB — URINALYSIS, ROUTINE W REFLEX MICROSCOPIC
Glucose, UA: NEGATIVE mg/dL
Hgb urine dipstick: NEGATIVE
Leukocytes, UA: NEGATIVE
Specific Gravity, Urine: 1.021 (ref 1.005–1.030)
Urobilinogen, UA: 0.2 mg/dL (ref 0.0–1.0)

## 2012-12-21 MED ORDER — METRONIDAZOLE 500 MG PO TABS
2000.0000 mg | ORAL_TABLET | Freq: Once | ORAL | Status: AC
  Administered 2012-12-21: 2000 mg via ORAL
  Filled 2012-12-21: qty 4

## 2012-12-21 NOTE — ED Provider Notes (Signed)
   History    CSN: [id number] Arrival date & time 12/21/12  1135  First MD Initiated Contact with Patient 12/21/12 1154     Chief Complaint  Patient presents with  . Exposure to STD   (Consider location/radiation/quality/duration/timing/severity/associated sxs/prior Treatment) Patient is a 37 y.o. male presenting with STD exposure.  Exposure to STD   Pt reports he was informed today that his sexual partner was diagnosed with trichomonas. He has not had any symptoms. No dysuria, penile discharge, fever or testicular pain. He states he was told she was neg for GC/C.   Past Medical History  Diagnosis Date  . Diabetes mellitus   . Asthma   . GERD (gastroesophageal reflux disease)    Past Surgical History  Procedure Laterality Date  . Cholecystectomy     No family history on file. History  Substance Use Topics  . Smoking status: Never Smoker   . Smokeless tobacco: Not on file  . Alcohol Use: No     Comment: occasional    Review of Systems All other systems reviewed and are negative except as noted in HPI.   Allergies  Fish allergy and Shellfish allergy  Home Medications  No current outpatient prescriptions on file. BP 150/82  Pulse 116  Temp(Src) 98 F (36.7 C) (Oral)  Resp 20  Ht 6' (1.829 m)  Wt 345 lb (156.491 kg)  BMI 46.78 kg/m2  SpO2 100% Physical Exam  Constitutional: He is oriented to person, place, and time. He appears well-developed and well-nourished.  HENT:  Head: Normocephalic and atraumatic.  Neck: Neck supple.  Pulmonary/Chest: Effort normal.  Genitourinary:  No penile discharge or testicular tenderness, no sores or lymphadenopathy  Neurological: He is alert and oriented to person, place, and time. No cranial nerve deficit.  Psychiatric: He has a normal mood and affect. His behavior is normal.    ED Course  Procedures (including critical care time) Labs Reviewed  GC/CHLAMYDIA PROBE AMP  URINALYSIS, ROUTINE W REFLEX MICROSCOPIC   No  results found.  1. Exposure to STD     MDM  Pt treated empirically for trichomonas, although UA here is neg. GC/C sent as well, but will hold of on treating as he states his partner was negative and he is asymptomatic.   Corneilus Heggie B. Bernette Mayers, MD 12/21/12 1231

## 2012-12-21 NOTE — ED Notes (Signed)
States his partner has trichomonas and he needs to be checked for an STD.

## 2012-12-22 LAB — GC/CHLAMYDIA PROBE AMP
CT Probe RNA: NEGATIVE
GC Probe RNA: NEGATIVE

## 2013-06-09 ENCOUNTER — Emergency Department (HOSPITAL_BASED_OUTPATIENT_CLINIC_OR_DEPARTMENT_OTHER): Payer: Self-pay

## 2013-06-09 ENCOUNTER — Encounter (HOSPITAL_BASED_OUTPATIENT_CLINIC_OR_DEPARTMENT_OTHER): Payer: Self-pay | Admitting: Emergency Medicine

## 2013-06-09 ENCOUNTER — Emergency Department (HOSPITAL_BASED_OUTPATIENT_CLINIC_OR_DEPARTMENT_OTHER)
Admission: EM | Admit: 2013-06-09 | Discharge: 2013-06-09 | Disposition: A | Discharge status: Home / Self Care | Payer: Self-pay | Attending: Emergency Medicine | Admitting: Emergency Medicine

## 2013-06-09 DIAGNOSIS — R197 Diarrhea, unspecified: Secondary | ICD-10-CM | POA: Insufficient documentation

## 2013-06-09 DIAGNOSIS — J069 Acute upper respiratory infection, unspecified: Secondary | ICD-10-CM | POA: Insufficient documentation

## 2013-06-09 DIAGNOSIS — Z87891 Personal history of nicotine dependence: Secondary | ICD-10-CM | POA: Insufficient documentation

## 2013-06-09 DIAGNOSIS — J45901 Unspecified asthma with (acute) exacerbation: Secondary | ICD-10-CM | POA: Insufficient documentation

## 2013-06-09 DIAGNOSIS — B9789 Other viral agents as the cause of diseases classified elsewhere: Secondary | ICD-10-CM | POA: Insufficient documentation

## 2013-06-09 DIAGNOSIS — J029 Acute pharyngitis, unspecified: Secondary | ICD-10-CM | POA: Insufficient documentation

## 2013-06-09 DIAGNOSIS — Z8719 Personal history of other diseases of the digestive system: Secondary | ICD-10-CM | POA: Insufficient documentation

## 2013-06-09 DIAGNOSIS — J45909 Unspecified asthma, uncomplicated: Secondary | ICD-10-CM

## 2013-06-09 DIAGNOSIS — B349 Viral infection, unspecified: Secondary | ICD-10-CM

## 2013-06-09 DIAGNOSIS — E119 Type 2 diabetes mellitus without complications: Secondary | ICD-10-CM | POA: Insufficient documentation

## 2013-06-09 MED ORDER — HYDROCODONE-HOMATROPINE 5-1.5 MG/5ML PO SYRP: 5.0000 mL | ORAL_SOLUTION | Freq: Four times a day (QID) | ORAL | Status: DC | PRN

## 2013-06-09 MED ORDER — ALBUTEROL SULFATE HFA 108 (90 BASE) MCG/ACT IN AERS: 2.0000 | INHALATION_SPRAY | RESPIRATORY_TRACT | Status: DC | PRN

## 2013-06-09 MED ORDER — PREDNISONE 20 MG PO TABS: 60.0000 mg | ORAL_TABLET | Freq: Every day | ORAL | Status: DC

## 2013-06-09 MED ORDER — ALBUTEROL SULFATE (5 MG/ML) 0.5% IN NEBU
2.5000 mg | INHALATION_SOLUTION | Freq: Once | RESPIRATORY_TRACT | Status: AC
  Administered 2013-06-09: 2.5 mg via RESPIRATORY_TRACT
  Filled 2013-06-09: qty 0.5

## 2013-06-09 NOTE — ED Notes (Signed)
Pt amb to room 4 with quick steady gait in nad. Pt reports cough, congestion x Sunday. Reports diarrhea on Tuesday, none since. Cont with cough and congestion.

## 2013-06-09 NOTE — ED Provider Notes (Signed)
CSN: [id number]     Arrival date & time 06/09/13  0825 History   First MD Initiated Contact with Patient 06/09/13 [phone]     Chief Complaint  Patient presents with  . Cough  . Nasal Congestion   (Consider location/radiation/quality/duration/timing/severity/associated sxs/prior Treatment) HPI Comments: Patient presents to the ER for evaluation of multiple complaints. Patient has been sick for nearly one week. He reports that he has had sinus congestion, sore throat, cough. Cough has been intermittently productive. He thinks he has had fever because he has had chills and sweats. He is not short of breath. He does report a history of asthma, but says it only acts up when he has bronchitis. He has had diarrhea earlier in the week, but that has resolved. No nausea or vomiting.  Patient is a 37 y.o. male presenting with cough.  Cough Associated symptoms: sore throat     Past Medical History  Diagnosis Date  . Diabetes mellitus   . Asthma   . GERD (gastroesophageal reflux disease)    Past Surgical History  Procedure Laterality Date  . Cholecystectomy     History reviewed. No pertinent family history. History  Substance Use Topics  . Smoking status: Former Games developer  . Smokeless tobacco: Not on file  . Alcohol Use: No     Comment: occasional    Review of Systems  HENT: Positive for congestion, sinus pressure and sore throat.   Respiratory: Positive for cough.   Gastrointestinal: Positive for diarrhea.  All other systems reviewed and are negative.    Allergies  Fish allergy and Shellfish allergy  Home Medications  No current outpatient prescriptions on file. BP 137/84  Pulse 98  Temp(Src) 98.4 F (36.9 C) (Oral)  Resp 20  Ht 6' (1.829 m)  Wt 354 lb 3 oz (160.658 kg)  BMI 48.03 kg/m2  SpO2 100% Physical Exam  Constitutional: He is oriented to person, place, and time. He appears well-developed and well-nourished. No distress.  HENT:  Head: Normocephalic and atraumatic.   Right Ear: Hearing normal.  Left Ear: Hearing normal.  Nose: Nose normal.  Mouth/Throat: Oropharynx is clear and moist and mucous membranes are normal.  Eyes: Conjunctivae and EOM are normal. Pupils are equal, round, and reactive to light.  Neck: Normal range of motion. Neck supple.  Cardiovascular: Regular rhythm, S1 normal and S2 normal.  Exam reveals no gallop and no friction rub.   No murmur heard. Pulmonary/Chest: Effort normal. No respiratory distress. He has wheezes. He has rhonchi. He exhibits no tenderness.  Abdominal: Soft. Normal appearance and bowel sounds are normal. There is no hepatosplenomegaly. There is no tenderness. There is no rebound, no guarding, no tenderness at McBurney's point and negative Murphy's sign. No hernia.  Musculoskeletal: Normal range of motion.  Neurological: He is alert and oriented to person, place, and time. He has normal strength. No cranial nerve deficit or sensory deficit. Coordination normal. GCS eye subscore is 4. GCS verbal subscore is 5. GCS motor subscore is 6.  Skin: Skin is warm, dry and intact. No rash noted. No cyanosis.  Psychiatric: He has a normal mood and affect. His speech is normal and behavior is normal. Thought content normal.    ED Course  Procedures (including critical care time) Labs Review Labs Reviewed  RAPID STREP SCREEN   Imaging Review Dg Chest 2 View  06/09/2013   CLINICAL DATA:  Cough and fever  EXAM: CHEST  2 VIEW  COMPARISON:  April 11, 2012  FINDINGS: The  lungs are clear. The heart size and pulmonary vascularity are normal. No adenopathy. No bone lesions.  IMPRESSION: No abnormality noted.   Electronically Signed   By: Bretta Bang M.D.   On: 06/09/2013 10:02    EKG Interpretation   None       MDM  Diagnosis: Viral illness/upper respiratory infection; asthma  Patient presents with upper respiratory infection symptoms exacerbating her asthma. She had some rhonchi scattered on examination as well as  wheezing. X-ray, however, shows no evidence of pneumonia. Patient also complaining of sore throat, rapid strep negative. She had some GI symptoms including nausea and diarrhea earlier in the week. This is consistent with a viral etiology. He is to be treated with continued bronchodilators, prednisone, medicated for nausea and diarrhea.    Gilda Crease, MD 06/09/13 1038

## 2013-06-12 LAB — CULTURE, GROUP A STREP

## 2013-06-13 NOTE — Progress Notes (Signed)
ED Antimicrobial Stewardship Positive Culture Follow Up   Benjamin Irwin is an 37 y.o. male who presented to [facility] on 06/09/2013 with a chief complaint of  Chief Complaint  Patient presents with  . Cough  . Nasal Congestion    Recent Results (from the past 720 hour(s))  RAPID STREP SCREEN     Status: None   Collection Time    06/09/13  9:25 AM      Result Value Range Status   Streptococcus, Group A Screen (Direct) NEGATIVE  NEGATIVE Final   Comment: (NOTE)     A Rapid Antigen test may result negative if the antigen level in the     sample is below the detection level of this test. The FDA has not     cleared this test as a stand-alone test therefore the rapid antigen     negative result has reflexed to a Group A Strep culture.  CULTURE, GROUP A STREP     Status: None   Collection Time    06/09/13  9:25 AM      Result Value Range Status   Specimen Description THROAT   Final   Special Requests NONE   Final   Culture     Final   Value: STREPTOCOCCUS,BETA HEMOLYTIC NOT GROUP A     Performed at [facility]   Report Status 06/12/2013 FINAL   Final    []  Treated with , organism resistant to prescribed antimicrobial [x]  Patient discharged originally without antimicrobial agent and treatment may now indicated  Recommendation: Perform symptom check. If symptoms improving, no treatment indicated. If symptoms persist/worsening, start Amoxicillin 500mg  PO TID x 7 days.  ED Provider: Sharilyn Sites, PA-C   Cleon Dew 06/13/2013, 10:03 AM Infectious Diseases Pharmacist Phone# [phone]

## 2013-06-16 ENCOUNTER — Telehealth (HOSPITAL_COMMUNITY): Payer: Self-pay | Admitting: *Deleted

## 2013-06-16 NOTE — ED Notes (Addendum)
Recommendation If symptoms improving,no treatment needed If symptoms  Persist/worsen start prescription below Amoxicillin 500 mg po TID x 7 days per Sharilyn Sites

## 2013-06-16 NOTE — ED Notes (Signed)
Unable to contact -Letter sent to [facility] address 

## 2013-10-17 ENCOUNTER — Emergency Department (HOSPITAL_BASED_OUTPATIENT_CLINIC_OR_DEPARTMENT_OTHER)
Admission: EM | Admit: 2013-10-17 | Discharge: 2013-10-17 | Disposition: A | Discharge status: Home / Self Care | Payer: Self-pay | Attending: Emergency Medicine | Admitting: Emergency Medicine

## 2013-10-17 ENCOUNTER — Emergency Department (HOSPITAL_BASED_OUTPATIENT_CLINIC_OR_DEPARTMENT_OTHER): Payer: Self-pay

## 2013-10-17 ENCOUNTER — Encounter (HOSPITAL_BASED_OUTPATIENT_CLINIC_OR_DEPARTMENT_OTHER): Payer: Self-pay | Admitting: Emergency Medicine

## 2013-10-17 DIAGNOSIS — E119 Type 2 diabetes mellitus without complications: Secondary | ICD-10-CM | POA: Insufficient documentation

## 2013-10-17 DIAGNOSIS — R109 Unspecified abdominal pain: Secondary | ICD-10-CM | POA: Insufficient documentation

## 2013-10-17 DIAGNOSIS — Z87891 Personal history of nicotine dependence: Secondary | ICD-10-CM | POA: Insufficient documentation

## 2013-10-17 DIAGNOSIS — Z79899 Other long term (current) drug therapy: Secondary | ICD-10-CM | POA: Insufficient documentation

## 2013-10-17 DIAGNOSIS — Z9089 Acquired absence of other organs: Secondary | ICD-10-CM | POA: Insufficient documentation

## 2013-10-17 DIAGNOSIS — Z8719 Personal history of other diseases of the digestive system: Secondary | ICD-10-CM | POA: Insufficient documentation

## 2013-10-17 DIAGNOSIS — IMO0002 Reserved for concepts with insufficient information to code with codable children: Secondary | ICD-10-CM | POA: Insufficient documentation

## 2013-10-17 DIAGNOSIS — R197 Diarrhea, unspecified: Secondary | ICD-10-CM | POA: Insufficient documentation

## 2013-10-17 DIAGNOSIS — J45909 Unspecified asthma, uncomplicated: Secondary | ICD-10-CM | POA: Insufficient documentation

## 2013-10-17 LAB — CBC WITH DIFFERENTIAL/PLATELET
BASOS ABS: 0 10*3/uL (ref 0.0–0.1)
BASOS PCT: 0 % (ref 0–1)
Eosinophils Absolute: 0.2 10*3/uL (ref 0.0–0.7)
Eosinophils Relative: 2 % (ref 0–5)
HCT: 42 % (ref 39.0–52.0)
HEMOGLOBIN: 13.7 g/dL (ref 13.0–17.0)
Lymphocytes Relative: 28 % (ref 12–46)
Lymphs Abs: 3.4 10*3/uL (ref 0.7–4.0)
MCH: 28.4 pg (ref 26.0–34.0)
MCHC: 32.6 g/dL (ref 30.0–36.0)
MCV: 87 fL (ref 78.0–100.0)
Monocytes Absolute: 1 10*3/uL (ref 0.1–1.0)
Monocytes Relative: 9 % (ref 3–12)
NEUTROS ABS: 7.2 10*3/uL (ref 1.7–7.7)
NEUTROS PCT: 61 % (ref 43–77)
Platelets: 287 10*3/uL (ref 150–400)
RBC: 4.83 MIL/uL (ref 4.22–5.81)
RDW: 14.6 % (ref 11.5–15.5)
WBC: 11.8 10*3/uL — ABNORMAL HIGH (ref 4.0–10.5)

## 2013-10-17 LAB — LIPASE, BLOOD: Lipase: 63 U/L — ABNORMAL HIGH (ref 11–59)

## 2013-10-17 LAB — COMPREHENSIVE METABOLIC PANEL
ALBUMIN: 3.5 g/dL (ref 3.5–5.2)
ALK PHOS: 93 U/L (ref 39–117)
ALT: 15 U/L (ref 0–53)
AST: 12 U/L (ref 0–37)
BUN: 8 mg/dL (ref 6–23)
CHLORIDE: 101 meq/L (ref 96–112)
CO2: 27 mEq/L (ref 19–32)
Calcium: 9.7 mg/dL (ref 8.4–10.5)
Creatinine, Ser: 0.7 mg/dL (ref 0.50–1.35)
GFR calc Af Amer: >90 mL/min (ref >90)
GFR calc non Af Amer: >90 mL/min (ref >90)
Glucose, Bld: 171 mg/dL — ABNORMAL HIGH (ref 70–99)
POTASSIUM: 4.1 meq/L (ref 3.7–5.3)
Sodium: 140 mEq/L (ref 137–147)
Total Protein: 7.4 g/dL (ref 6.0–8.3)

## 2013-10-17 LAB — URINALYSIS, ROUTINE W REFLEX MICROSCOPIC
BILIRUBIN URINE: NEGATIVE
GLUCOSE, UA: NEGATIVE mg/dL
Hgb urine dipstick: NEGATIVE
KETONES UR: NEGATIVE mg/dL
LEUKOCYTES UA: NEGATIVE
Nitrite: NEGATIVE
PH: 6 (ref 5.0–8.0)
Protein, ur: NEGATIVE mg/dL
Specific Gravity, Urine: 1.022 (ref 1.005–1.030)
Urobilinogen, UA: 0.2 mg/dL (ref 0.0–1.0)

## 2013-10-17 MED ORDER — GI COCKTAIL ~~LOC~~
30.0000 mL | Freq: Once | ORAL | Status: AC
  Administered 2013-10-17: 30 mL via ORAL
  Filled 2013-10-17: qty 30

## 2013-10-17 NOTE — ED Provider Notes (Signed)
CSN: [id number]     Arrival date & time 10/17/13  0841 History   First MD Initiated Contact with Patient 10/17/13 [phone]     Chief Complaint  Patient presents with  . Abdominal Pain     (Consider location/radiation/quality/duration/timing/severity/associated sxs/prior Treatment) HPI  38 year old male who comes in today stating he has had several episodes of diarrhea over the past 2 days. He states it begins after he eats food and he feels some cramping and then will have a bowel movement. He denies any nausea, vomiting, or fever. He has previously had a cholecystectomy. He is morbidly obese but has lost 45 pounds. He states he has not taking medication for diabetes any longer. He denies any blood in his stool. He describes this as loose stools but they have been more formed. He has had up to 6 episodes of loose stool per day during the past 48 hours. He denies fever or chills.  Past Medical History  Diagnosis Date  . Diabetes mellitus   . Asthma   . GERD (gastroesophageal reflux disease)    Past Surgical History  Procedure Laterality Date  . Cholecystectomy     History reviewed. No pertinent family history. History  Substance Use Topics  . Smoking status: Former Games developermoker  . Smokeless tobacco: Not on file  . Alcohol Use: No     Comment: occasional    Review of Systems  All other systems reviewed and are negative.     Allergies  Fish allergy and Shellfish allergy  Home Medications   Prior to Admission medications   Medication Sig Start Date End Date Taking? Authorizing Provider  albuterol (PROVENTIL HFA;VENTOLIN HFA) 108 (90 BASE) MCG/ACT inhaler Inhale 2 puffs into the lungs every 4 (four) hours as needed for wheezing or shortness of breath. 06/09/13   Gilda Creasehristopher J. Pollina, MD  HYDROcodone-homatropine [facility](HYCODAN) 5-1.5 MG/5ML syrup Take 5 mLs by mouth every 6 (six) hours as needed for cough. 06/09/13   Gilda Creasehristopher J. Pollina, MD  predniSONE (DELTASONE) 20 MG tablet Take 3  tablets (60 mg total) by mouth daily with breakfast. 06/09/13   Gilda Creasehristopher J. Pollina, MD   BP 153/90  Pulse 87  Temp(Src) 98.4 F (36.9 C) (Oral)  Resp 16  SpO2 100% Physical Exam  Nursing note and vitals reviewed. Constitutional: He is oriented to person, place, and time. He appears well-developed and well-nourished.  HENT:  Head: Normocephalic and atraumatic.  Right Ear: External ear normal.  Left Ear: External ear normal.  Nose: Nose normal.  Mouth/Throat: Oropharynx is clear and moist.  Eyes: Conjunctivae and EOM are normal. Pupils are equal, round, and reactive to light.  Neck: Normal range of motion. Neck supple.  Cardiovascular: Normal rate, regular rhythm, normal heart sounds and intact distal pulses.   Pulmonary/Chest: Effort normal and breath sounds normal.  Abdominal: Soft. Bowel sounds are normal. He exhibits no distension and no mass. There is no tenderness. There is no rebound and no guarding.  Musculoskeletal: Normal range of motion.  Neurological: He is alert and oriented to person, place, and time. He has normal reflexes.  Skin: Skin is warm and dry.  Psychiatric: He has a normal mood and affect. His behavior is normal. Judgment and thought content normal.    ED Course  Procedures (including critical care time) Labs Review Labs Reviewed  CBC WITH DIFFERENTIAL - Abnormal; Notable for the following:    WBC 11.8 (*)    All other components within normal limits  COMPREHENSIVE METABOLIC PANEL -  Abnormal; Notable for the following:    Glucose, Bld 171 (*)    Total Bilirubin <0.2 (*)    All other components within normal limits  LIPASE, BLOOD - Abnormal; Notable for the following:    Lipase 63 (*)    All other components within normal limits  URINALYSIS, ROUTINE W REFLEX MICROSCOPIC    Imaging Review Dg Abd 1 View  10/17/2013   CLINICAL DATA:  Epigastric abdominal pain and nausea.  EXAM: ABDOMEN - 1 VIEW  COMPARISON:  None.  FINDINGS: There is minimal air  scattered throughout the nondistended bowel. There is no excessive stool in the colon. Surgical clips in the right upper quadrant are probably secondary to prior cholecystectomy. No appreciable free fluid or free air on these supine radiographs. No osseous abnormality.  IMPRESSION: Benign appearing abdomen.   Electronically Signed   By: Geanie CooleyJim  Maxwell M.D.   On: 10/17/2013 09:25     EKG Interpretation None      MDM   Final diagnoses:  Abdominal pain  Diarrhea       Benjamin Quarryanielle S Brylei Pedley, MD 10/17/13 1340

## 2013-10-17 NOTE — Discharge Instructions (Signed)
Abdominal Pain, Adult Many things can cause belly (abdominal) pain. Most times, the belly pain is not dangerous. Many cases of belly pain can be watched and treated at home. HOME CARE   Do not take medicines that help you go poop (laxatives) unless told to by your doctor.  Only take medicine as told by your doctor.  Eat or drink as told by your doctor. Your doctor will tell you if you should be on a special diet. GET HELP IF:  You do not know what is causing your belly pain.  You have belly pain while you are sick to your stomach (nauseous) or have runny poop (diarrhea).  You have pain while you pee or poop.  Your belly pain wakes you up at night.  You have belly pain that gets worse or better when you eat.  You have belly pain that gets worse when you eat fatty foods. GET HELP RIGHT AWAY IF:   The pain does not go away within 2 hours.  You have a fever.  You keep throwing up (vomiting).  The pain changes and is only in the right or left part of the belly.  You have bloody or tarry looking poop. MAKE SURE YOU:   Understand these instructions.  Will watch your condition.  Will get help right away if you are not doing well or get worse. Document Released: 11/25/2007 Document Revised: 03/29/2013 Document Reviewed: 02/15/2013 [facility] Patient Information 2014 [place], [place]. Diarrhea Diarrhea is watery poop (stool). It can make you feel weak, tired, thirsty, or give you a dry mouth (signs of dehydration). Watery poop is a sign of another problem, most often an infection. It often lasts 2 3 days. It can last longer if it is a sign of something serious. Take care of yourself as told by your doctor. HOME CARE   Drink 1 cup (8 ounces) of fluid each time you have watery poop.  Do not drink the following fluids:  Those that contain simple sugars (fructose, glucose, galactose, lactose, sucrose, maltose).  Sports drinks.  Fruit juices.  Whole milk  products.  Sodas.  Drinks with caffeine (coffee, tea, soda) or alcohol.  Oral rehydration solution may be used if the doctor says it is okay. You may make your own solution. Follow this recipe:    teaspoon table salt.   teaspoon baking soda.   teaspoon salt substitute containing potassium chloride.  1 tablespoons sugar.  1 liter (34 ounces) of water.  Avoid the following foods:  High fiber foods, such as raw fruits and vegetables.  Nuts, seeds, and whole grain breads and cereals.   Those that are sweetened with sugar alcohols (xylitol, sorbitol, mannitol).  Try eating the following foods:  Starchy foods, such as rice, toast, pasta, low-sugar cereal, oatmeal, baked potatoes, crackers, and bagels.  Bananas.  Applesauce.  Eat probiotic-rich foods, such as yogurt and milk products that are fermented.  Wash your hands well after each time you have watery poop.  Only take medicine as told by your doctor.  Take a warm bath to help lessen burning or pain from having watery poop. GET HELP RIGHT AWAY IF:   You cannot drink fluids without throwing up (vomiting).  You keep throwing up.  You have blood in your poop, or your poop looks black and tarry.  You do not pee (urinate) in 6 8 hours, or there is only a small amount of very dark pee.  You have belly (abdominal) pain that gets worse or  stays in the same spot (localizes).  You are weak, dizzy, confused, or lightheaded.  You have a very bad headache.  Your watery poop gets worse or does not get better.  You have a fever or lasting symptoms for more than 2 3 days.  You have a fever and your symptoms suddenly get worse. MAKE SURE YOU:   Understand these instructions.  Will watch your condition.  Will get help right away if you are not doing well or get worse. Document Released: 11/25/2007 Document Revised: 03/02/2012 Document Reviewed: 02/14/2012 [facility]Exit[facility] Patient Information 2014 [place]ExitCare, [place].

## 2013-10-17 NOTE — ED Notes (Signed)
Abdominal pain with diarrhea onset Friday got better over the weekend but started again today had 2 diarrhea stools no nausea or vomiting

## 2013-10-17 NOTE — ED Notes (Signed)
Pt also having wheezing is out of albuterol and states he would like a chest x ray to be sure

## 2013-10-28 IMAGING — CR DG FOOT COMPLETE 3+V*R*
3 series · 3 of 3 positions shown · non-contrast
Comparison: None

CLINICAL DATA: Injured right foot.

RIGHT FOOT COMPLETE - 3+ VIEW

[t foot lat right]
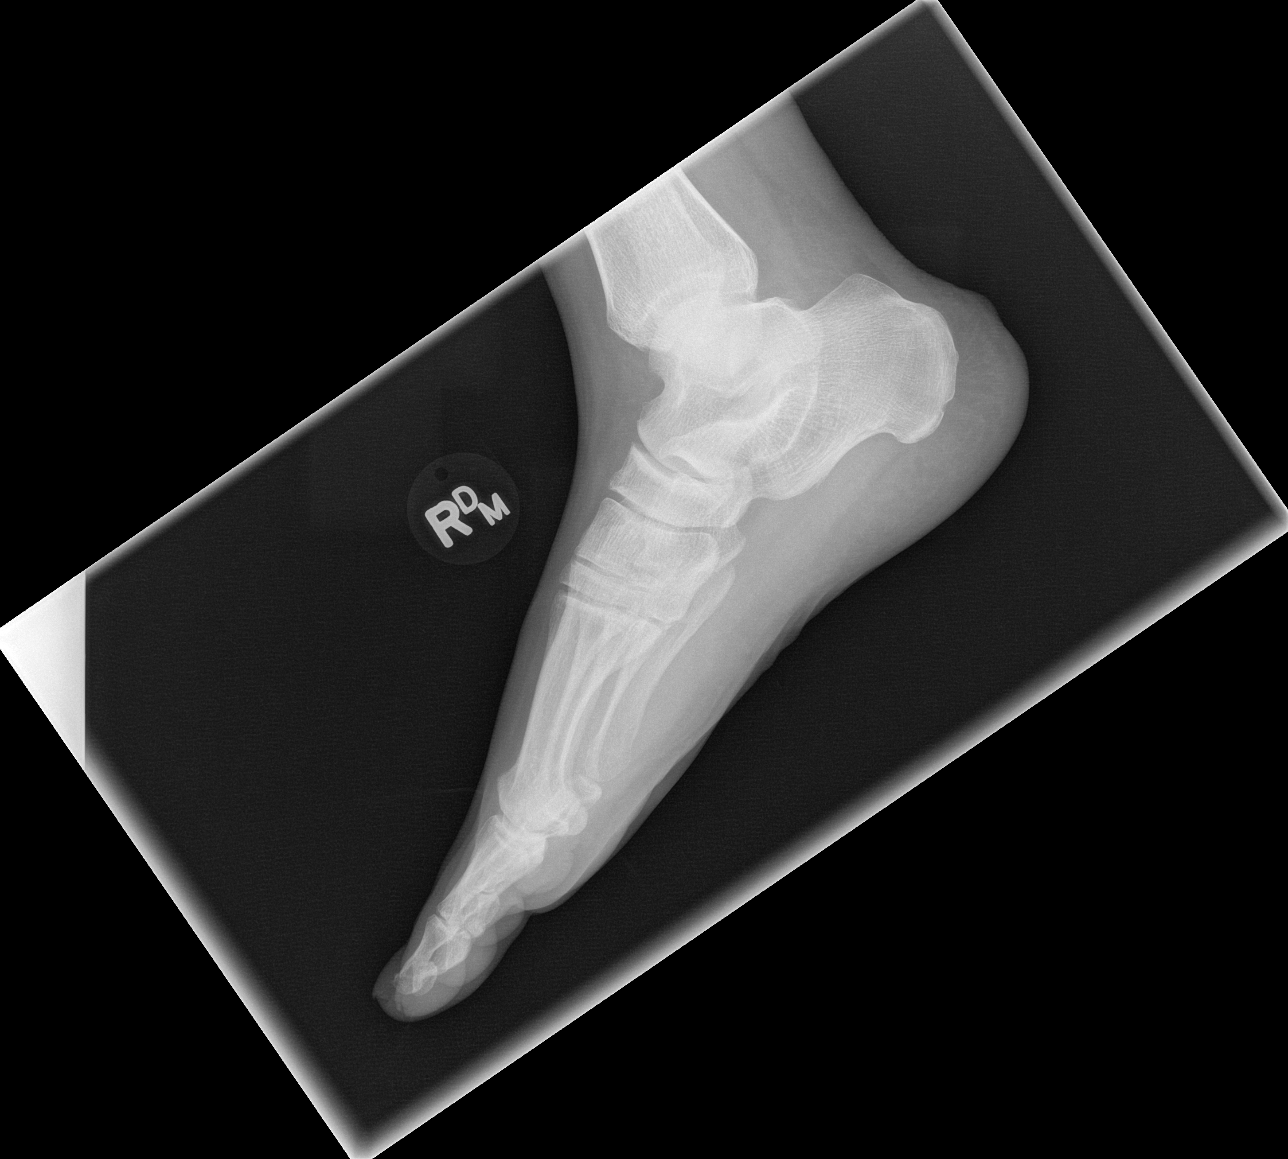

[t foot ap right]
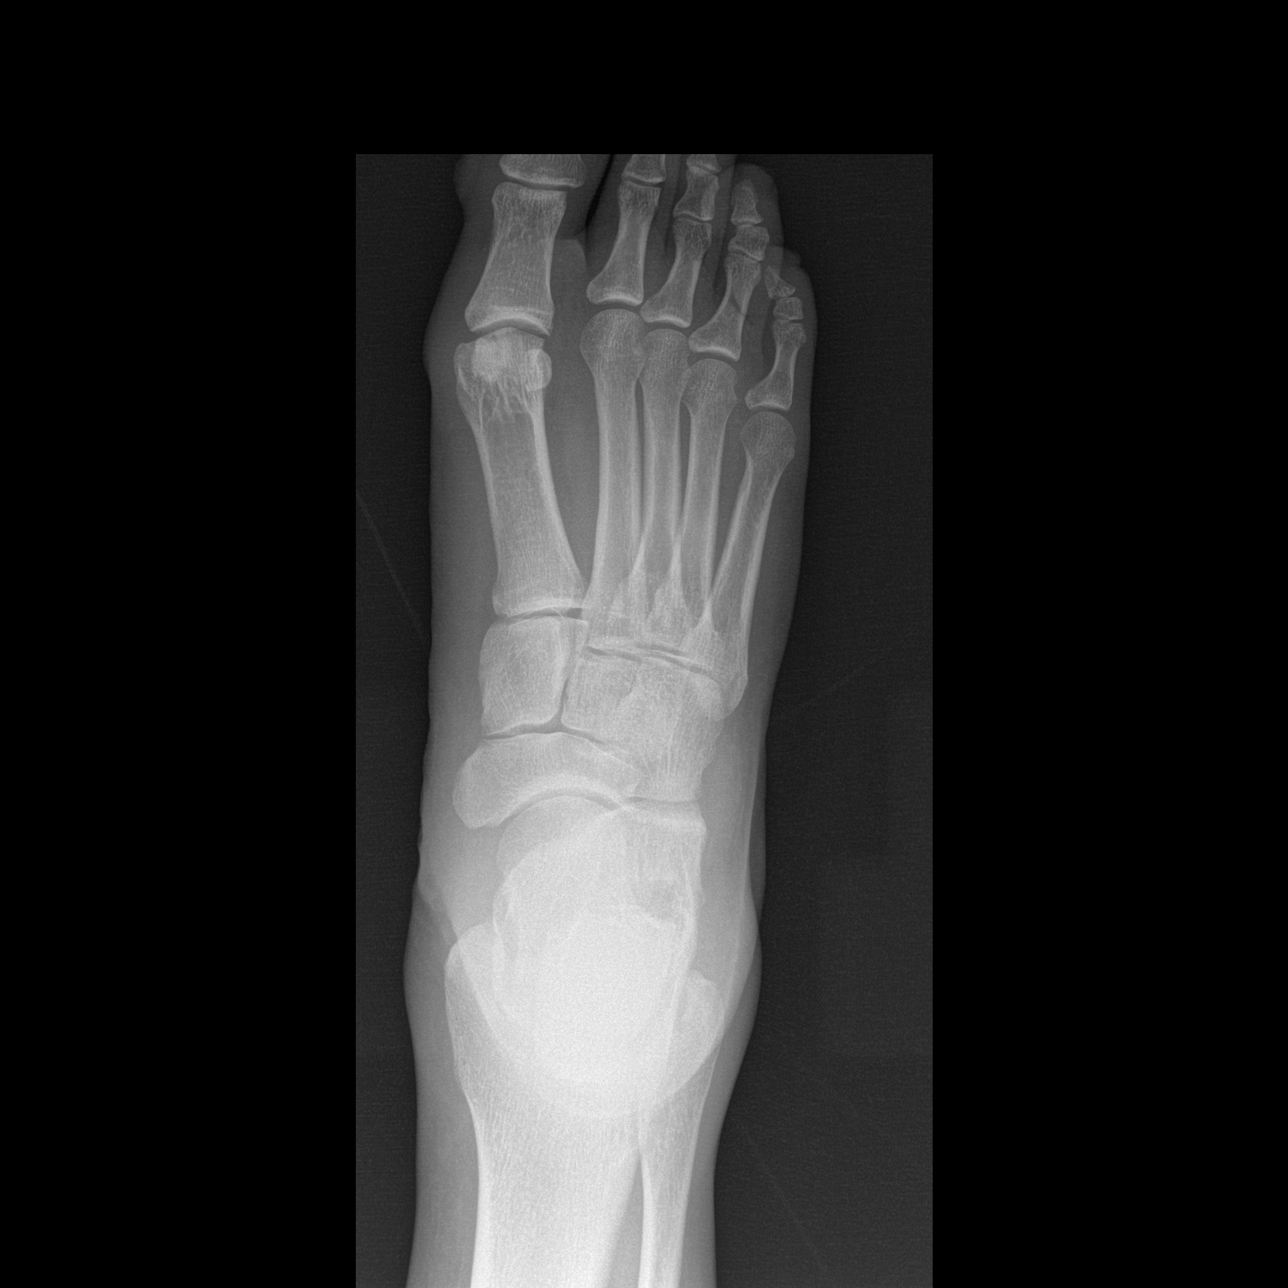

[t foot oblique right]
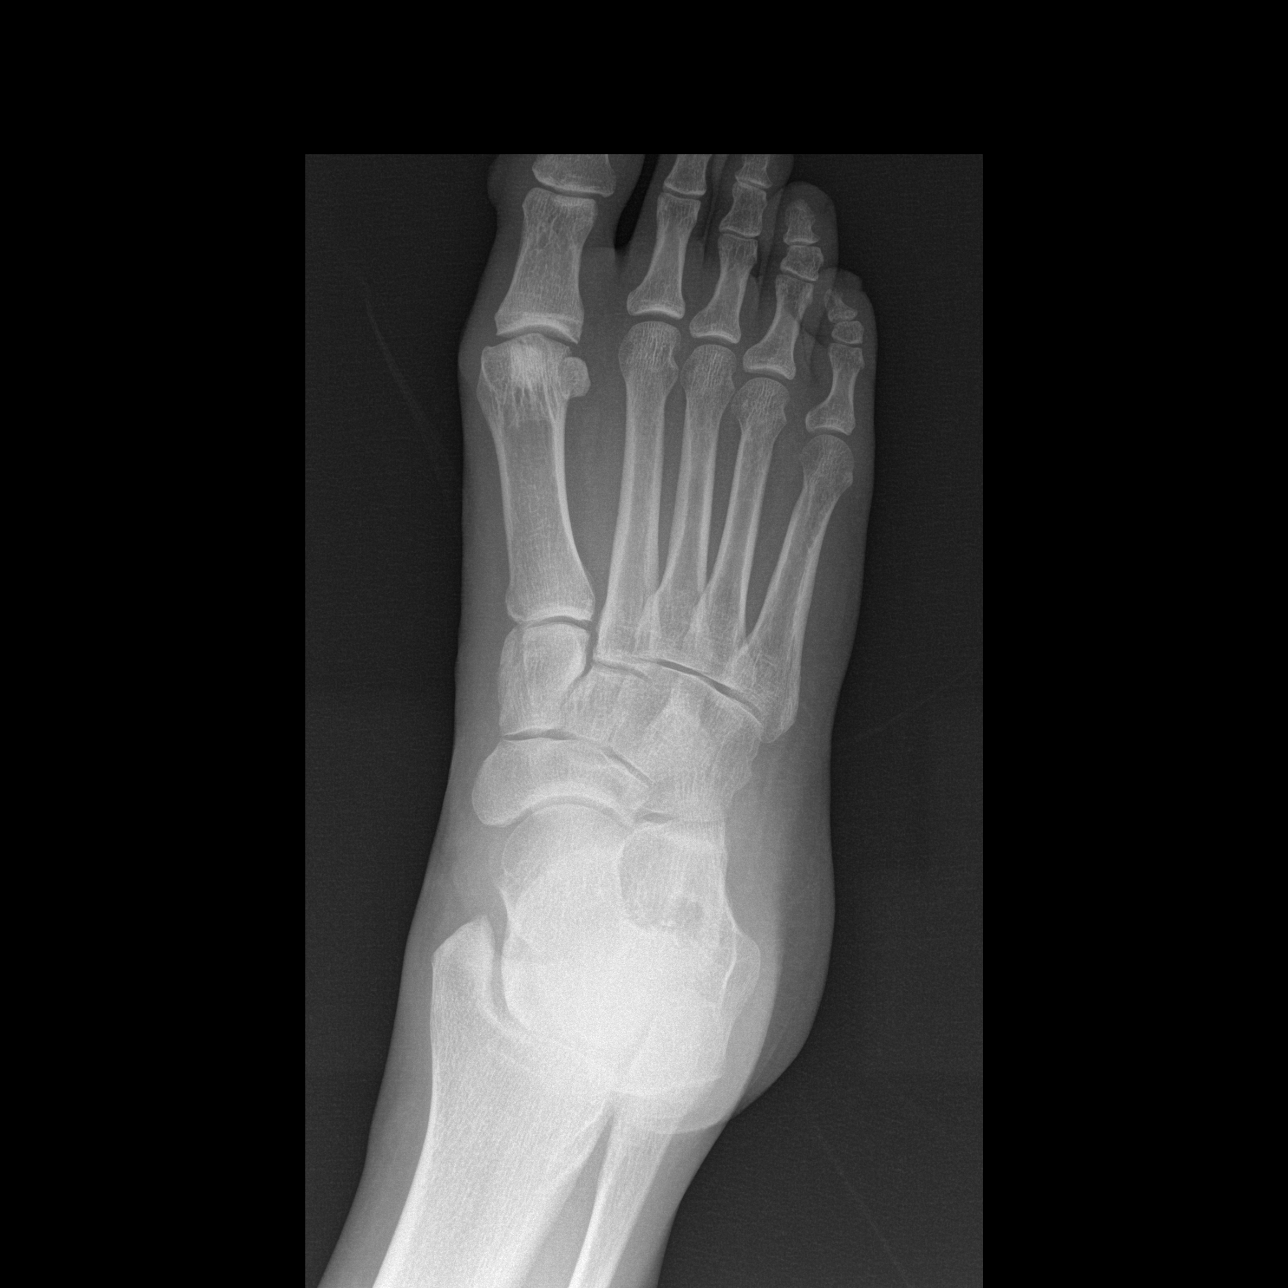

[3 of 3 positions shown; findings below may reference images not displayed]

FINDINGS: The joint spaces are maintained.  Mild degenerative
changes at the first metatarsal phalangeal joint.  No acute
fracture.
IMPRESSION: No acute bony findings.

## 2014-07-18 ENCOUNTER — Emergency Department: Payer: Self-pay | Admitting: Emergency Medicine

## 2014-10-23 ENCOUNTER — Encounter (HOSPITAL_BASED_OUTPATIENT_CLINIC_OR_DEPARTMENT_OTHER): Payer: Self-pay

## 2014-10-23 ENCOUNTER — Emergency Department (HOSPITAL_BASED_OUTPATIENT_CLINIC_OR_DEPARTMENT_OTHER)
Admission: EM | Admit: 2014-10-23 | Discharge: 2014-10-23 | Disposition: A | Discharge status: Home / Self Care | Payer: Medicaid Other | Attending: Emergency Medicine | Admitting: Emergency Medicine

## 2014-10-23 DIAGNOSIS — Z87891 Personal history of nicotine dependence: Secondary | ICD-10-CM | POA: Insufficient documentation

## 2014-10-23 DIAGNOSIS — L293 Anogenital pruritus, unspecified: Secondary | ICD-10-CM | POA: Diagnosis not present

## 2014-10-23 DIAGNOSIS — Z8719 Personal history of other diseases of the digestive system: Secondary | ICD-10-CM | POA: Insufficient documentation

## 2014-10-23 DIAGNOSIS — J45909 Unspecified asthma, uncomplicated: Secondary | ICD-10-CM | POA: Diagnosis not present

## 2014-10-23 DIAGNOSIS — L290 Pruritus ani: Secondary | ICD-10-CM

## 2014-10-23 DIAGNOSIS — L02214 Cutaneous abscess of groin: Secondary | ICD-10-CM | POA: Diagnosis not present

## 2014-10-23 DIAGNOSIS — R000 Tachycardia, unspecified: Secondary | ICD-10-CM | POA: Insufficient documentation

## 2014-10-23 DIAGNOSIS — E119 Type 2 diabetes mellitus without complications: Secondary | ICD-10-CM | POA: Insufficient documentation

## 2014-10-23 LAB — URINALYSIS, ROUTINE W REFLEX MICROSCOPIC
Bilirubin Urine: NEGATIVE
Glucose, UA: >1000 mg/dL — AB
Hgb urine dipstick: NEGATIVE
Ketones, ur: NEGATIVE mg/dL
Leukocytes, UA: NEGATIVE
NITRITE: NEGATIVE
PROTEIN: NEGATIVE mg/dL
SPECIFIC GRAVITY, URINE: 1.04 — AB (ref 1.005–1.030)
Urobilinogen, UA: 0.2 mg/dL (ref 0.0–1.0)
pH: 5.5 (ref 5.0–8.0)

## 2014-10-23 LAB — URINE MICROSCOPIC-ADD ON

## 2014-10-23 MED ORDER — SULFAMETHOXAZOLE-TRIMETHOPRIM 800-160 MG PO TABS: 1.0000 | ORAL_TABLET | Freq: Two times a day (BID) | ORAL | Status: AC

## 2014-10-23 MED ORDER — CEPHALEXIN 500 MG PO CAPS: 500.0000 mg | ORAL_CAPSULE | Freq: Four times a day (QID) | ORAL | Status: DC

## 2014-10-23 NOTE — Discharge Instructions (Signed)
Take both antibiotics to completion. Apply warm compresses. Establish care with the wellness clinic for primary care physician.  Abscess An abscess is an infected area that contains a collection of pus and debris.It can occur in almost any part of the body. An abscess is also known as a furuncle or boil. CAUSES  An abscess occurs when tissue gets infected. This can occur from blockage of oil or sweat glands, infection of hair follicles, or a minor injury to the skin. As the body tries to fight the infection, pus collects in the area and creates pressure under the skin. This pressure causes pain. People with weakened immune systems have difficulty fighting infections and get certain abscesses more often.  SYMPTOMS Usually an abscess develops on the skin and becomes a painful mass that is red, warm, and tender. If the abscess forms under the skin, you may feel a moveable soft area under the skin. Some abscesses break open (rupture) on their own, but most will continue to get worse without care. The infection can spread deeper into the body and eventually into the bloodstream, causing you to feel ill.  DIAGNOSIS  Your caregiver will take your medical history and perform a physical exam. A sample of fluid may also be taken from the abscess to determine what is causing your infection. TREATMENT  Your caregiver may prescribe antibiotic medicines to fight the infection. However, taking antibiotics alone usually does not cure an abscess. Your caregiver may need to make a small cut (incision) in the abscess to drain the pus. In some cases, gauze is packed into the abscess to reduce pain and to continue draining the area. HOME CARE INSTRUCTIONS   Only take over-the-counter or prescription medicines for pain, discomfort, or fever as directed by your caregiver.  If you were prescribed antibiotics, take them as directed. Finish them even if you start to feel better.  If gauze is used, follow your caregiver's  directions for changing the gauze.  To avoid spreading the infection:  Keep your draining abscess covered with a bandage.  Wash your hands well.  Do not share personal care items, towels, or whirlpools with others.  Avoid skin contact with others.  Keep your skin and clothes clean around the abscess.  Keep all follow-up appointments as directed by your caregiver. SEEK MEDICAL CARE IF:   You have increased pain, swelling, redness, fluid drainage, or bleeding.  You have muscle aches, chills, or a general ill feeling.  You have a fever. MAKE SURE YOU:   Understand these instructions.  Will watch your condition.  Will get help right away if you are not doing well or get worse. Document Released: 03/18/2005 Document Revised: 12/08/2011 Document Reviewed: 08/21/2011 [facility] Patient Information 2015 [place], [place]. This information is not intended to replace advice given to you by your health care provider. Make sure you discuss any questions you have with your health care provider.  Anal Pruritus Anal pruritus is an itching of the anus, which is often due to increased moisture of the skin around the anus. Moisture may be due to sweating or a small amount of remaining stool. The itching and scratching can cause further skin damage.  CAUSES   Poor hygiene.  Excessive moisture from sweating or residual stool in the anal area.  Perfumed soaps and sprays and colored toilet paper.  Chemicals in the foods you eat.  Dietary factors such as caffeine, beer, milk products, chocolate, nuts, citrus fruits, tomatoes, spicy seasonings, jalapeno peppers, and salsa.  Hemorrhoids, infections, and other anal diseases.  Excessive washing.  Overuse of laxatives.  Skin disorders (psoriasis, eczema, or seborrhea). HOME CARE INSTRUCTIONS   Practice good hygiene.  Clean the anal area gently with wet toilet paper, baby wipes, or a wet washcloth after every bowel movement and at bedtime.  Avoid using soaps on the anal area. Dry the area thoroughly. Pat the area dry with toilet paper or a towel.  Do not scrub the anal area with anything, even toilet paper.  Try not to scratch the itchy area. Scratching produces more damage, which makes the itching worse.  Take sitz baths in warm water for 15 to 20 minutes, 2 to 3 times a day. Pat the area dry with a soft cloth after each bath.  Zinc oxide ointment or a moisture barrier cream can be applied several times daily to protect the skin.  Only take medicines as directed by your caregiver.  Talk to your caregiver about fiber supplements. These are helpful in normalizing the stool if you have frequent loose stools.  Wear cotton underwear and loose clothing.  Do not use irritants such as bubble baths, scented toilet paper, or genital deodorants. SEEK MEDICAL CARE IF:   Itching does not improve in several days or gets worse.  You have a fever.  There are problems with increased pain, swelling, or redness. MAKE SURE YOU:   Understand these instructions.  Will watch your condition.  Will get help right away if you are not doing well or get worse. Document Released: 12/08/2010 Document Revised: 08/31/2011 Document Reviewed: 12/08/2010 [facility]Exit[facility] Patient Information 2015 [place]ExitCare, [place]. This information is not intended to replace advice given to you by your health care provider. Make sure you discuss any questions you have with your health care provider.

## 2014-10-23 NOTE — ED Notes (Signed)
Abscess to scrotum x 1 week-also itching to penis and rectal area after girlfriend applied chocolate sauce to areas x 1 month

## 2014-10-23 NOTE — ED Provider Notes (Signed)
CSN: [id number]     Arrival date & time 10/23/14  1957 History   First MD Initiated Contact with Patient 10/23/14 2046     Chief Complaint  Patient presents with  . Abscess     (Consider location/radiation/quality/duration/timing/severity/associated sxs/prior Treatment) HPI Comments: 39 year old morbidly obese male complaining of a bump to his left groin area 1 week. States the area is slightly tender and is red. Denies any drainage. Denies fevers. Also reports itching around his penis and rectal area after his girlfriend applied caramel sauce to those areas one month ago. He has not noticed any rash. States he has been applying Lotrimin cream to it with no relief of the itching. Denies penile discharge or pain. Denies abdominal pain, nausea, vomiting, fever or chills.  Patient is a 39 y.o. male presenting with abscess. The history is provided by the patient.  Abscess   Past Medical History  Diagnosis Date  . Diabetes mellitus   . Asthma   . GERD (gastroesophageal reflux disease)    Past Surgical History  Procedure Laterality Date  . Cholecystectomy     No family history on file. History  Substance Use Topics  . Smoking status: Former Games developer  . Smokeless tobacco: Not on file  . Alcohol Use: No    Review of Systems  Skin: Positive for color change.  All other systems reviewed and are negative.     Allergies  Fish allergy and Shellfish allergy  Home Medications   Prior to Admission medications   Medication Sig Start Date End Date Taking? Authorizing Provider  cephALEXin (KEFLEX) 500 MG capsule Take 1 capsule (500 mg total) by mouth 4 (four) times daily. 10/23/14   Kathrynn Speed, PA-C  sulfamethoxazole-trimethoprim (BACTRIM DS,SEPTRA DS) 800-160 MG per tablet Take 1 tablet by mouth 2 (two) times daily. 10/23/14 10/30/14  Benjamin Tavella M Nada Godley, PA-C   BP 141/100 mmHg  Pulse 126  Temp(Src) 98 F (36.7 C) (Oral)  Resp 22  Ht  (1.803 m)  Wt 344 lb (156.037 kg)  BMI 48.00  kg/m2  SpO2 100% Physical Exam  Constitutional: He is oriented to person, place, and time. He appears well-developed and well-nourished. No distress.  Morbidly obese.  HENT:  Head: Normocephalic and atraumatic.  Eyes: Conjunctivae and EOM are normal.  Neck: Normal range of motion. Neck supple.  Cardiovascular: Regular rhythm and normal heart sounds.   Tachy ~110.  Pulmonary/Chest: Effort normal and breath sounds normal.  Genitourinary:     No rashes present. No lesions.  Musculoskeletal: Normal range of motion. He exhibits no edema.  Neurological: He is alert and oriented to person, place, and time.  Skin: Skin is warm and dry.  Psychiatric: He has a normal mood and affect. His behavior is normal.  Nursing note and vitals reviewed.   ED Course  Procedures (including critical care time) Labs Review Labs Reviewed  URINALYSIS, ROUTINE W REFLEX MICROSCOPIC - Abnormal; Notable for the following:    Specific Gravity, Urine 1.040 (*)    Glucose, UA >1000 (*)    All other components within normal limits  URINE MICROSCOPIC-ADD ON    Imaging Review No results found.   EKG Interpretation None      MDM   Final diagnoses:  Abscess of left groin  Rectal itching   Non-toxic appearing, NAD. Tachycardic, vitals otherwise stable. The redness is in his groin area, not on his scrotum. There is only a small amount of induration, with no area to drain for an  abscess. No pustular drainage. Will start patient on Bactrim and Keflex given the erythema and warmth. Regarding the itching to his genital and rectal area, there is no rash. The itching is more so around his buttock. no excoriations. I advised against the use of caramel sauce in that area, and advised him to follow-up with his PCP. Stable for d/c. Return precautions given. Patient states understanding of treatment care plan and is agreeable.  Kathrynn SpeedRobyn M Audrie Kuri, PA-C 10/23/14 2158  Rolan BuccoMelanie Belfi, MD 10/23/14 2328

## 2015-05-18 ENCOUNTER — Emergency Department (HOSPITAL_BASED_OUTPATIENT_CLINIC_OR_DEPARTMENT_OTHER)
Admission: EM | Admit: 2015-05-18 | Discharge: 2015-05-18 | Disposition: A | Discharge status: Home / Self Care | Payer: Medicaid Other | Attending: Emergency Medicine | Admitting: Emergency Medicine

## 2015-05-18 ENCOUNTER — Encounter (HOSPITAL_BASED_OUTPATIENT_CLINIC_OR_DEPARTMENT_OTHER): Payer: Self-pay | Admitting: Adult Health

## 2015-05-18 DIAGNOSIS — Z792 Long term (current) use of antibiotics: Secondary | ICD-10-CM | POA: Insufficient documentation

## 2015-05-18 DIAGNOSIS — E119 Type 2 diabetes mellitus without complications: Secondary | ICD-10-CM | POA: Insufficient documentation

## 2015-05-18 DIAGNOSIS — N492 Inflammatory disorders of scrotum: Secondary | ICD-10-CM | POA: Diagnosis present

## 2015-05-18 DIAGNOSIS — J45909 Unspecified asthma, uncomplicated: Secondary | ICD-10-CM | POA: Diagnosis not present

## 2015-05-18 DIAGNOSIS — Z87891 Personal history of nicotine dependence: Secondary | ICD-10-CM | POA: Insufficient documentation

## 2015-05-18 DIAGNOSIS — Z8719 Personal history of other diseases of the digestive system: Secondary | ICD-10-CM | POA: Insufficient documentation

## 2015-05-18 DIAGNOSIS — Z79899 Other long term (current) drug therapy: Secondary | ICD-10-CM | POA: Insufficient documentation

## 2015-05-18 MED ORDER — SULFAMETHOXAZOLE-TRIMETHOPRIM 800-160 MG PO TABS: 1.0000 | ORAL_TABLET | Freq: Two times a day (BID) | ORAL | Status: AC

## 2015-05-18 MED ORDER — SULFAMETHOXAZOLE-TRIMETHOPRIM 800-160 MG PO TABS
1.0000 | ORAL_TABLET | Freq: Once | ORAL | Status: AC
  Administered 2015-05-18: 1 via ORAL
  Filled 2015-05-18: qty 1

## 2015-05-18 MED ORDER — HYDROCODONE-ACETAMINOPHEN 5-325 MG PO TABS: ORAL_TABLET | ORAL | Status: DC

## 2015-05-18 MED ORDER — LIDOCAINE HCL 2 % IJ SOLN
20.0000 mL | Freq: Once | INTRAMUSCULAR | Status: AC
  Administered 2015-05-18: 400 mg via INTRADERMAL
  Filled 2015-05-18: qty 20

## 2015-05-18 NOTE — ED Notes (Signed)
Pt seen by EDPA prior to RN assessment, see PA notes, orders received to medicate and d/c. RN assessment s/p I&D. Denies pain or other sx. "feel better". R groin dressing in place.

## 2015-05-18 NOTE — ED Notes (Signed)
ED PA at BS 

## 2015-05-18 NOTE — Discharge Instructions (Signed)
Please read and follow all provided instructions.  Your diagnoses today include:  1. Abscess of scrotal wall    Tests performed today include:  Vital signs. See below for your results today.   Medications prescribed:   Bactrim (trimethoprim/sulfamethoxazole) - antibiotic  You have been prescribed an antibiotic medicine: take the entire course of medicine even if you are feeling better. Stopping early can cause the antibiotic not to work.   Vicodin (hydrocodone/acetaminophen) - narcotic pain medication  DO NOT drive or perform any activities that require you to be awake and alert because this medicine can make you drowsy. BE VERY CAREFUL not to take multiple medicines containing Tylenol (also called acetaminophen). Doing so can lead to an overdose which can damage your liver and cause liver failure and possibly death.  Take any prescribed medications only as directed.   Home care instructions:   Follow any educational materials contained in this packet  Follow-up instructions: Return to the Emergency Department or see your doctor in 48 hours for a recheck.  Please follow-up with your primary care provider in the next 1 week for further evaluation of your symptoms.   Return instructions:  Return to the Emergency Department if you have:  Fever  Worsening symptoms  Worsening pain  Worsening swelling  Redness of the skin that moves away from the affected area, especially if it streaks away from the affected area   Any other emergent concerns  Your vital signs today were: BP 123/83 mmHg   Pulse 110   Temp(Src) 98 F (36.7 C) (Oral)   Resp 18   Ht 6' (1.829 m)   Wt 151.501 kg   BMI 45.29 kg/m2   SpO2 98% If your blood pressure (BP) was elevated above 135/85 this visit, please have this repeated by your doctor within one month. --------------

## 2015-05-18 NOTE — ED Provider Notes (Signed)
CSN: [id number]     Arrival date & time 05/18/15  2138 History   First MD Initiated Contact with Patient 05/18/15 2235     Chief Complaint  Patient presents with  . Abscess     (Consider location/radiation/quality/duration/timing/severity/associated sxs/prior Treatment) HPI Comments: Patient presents with complaint of R groin abscess for the past several days -- drained a large amount of pus tonight with improvement in pain. Patient was concerned that there was large and may need antibiotics so he decided to come to the emergency department for evaluation. He denies difficulty with urination, fever, vomiting. Patient has had similar symptoms in the past. Onset of symptoms acute. Course is improving.  Patient is a 39 y.o. male presenting with abscess. The history is provided by the patient.  Abscess Associated symptoms: no fever, no nausea and no vomiting     Past Medical History  Diagnosis Date  . Diabetes mellitus   . Asthma   . GERD (gastroesophageal reflux disease)    Past Surgical History  Procedure Laterality Date  . Cholecystectomy     History reviewed. No pertinent family history. Social History  Substance Use Topics  . Smoking status: Former Games developermoker  . Smokeless tobacco: None  . Alcohol Use: No    Review of Systems  Constitutional: Negative for fever.  Gastrointestinal: Negative for nausea and vomiting.  Skin: Positive for color change.       Positive for abscess  Hematological: Negative for adenopathy.      Allergies  Fish allergy and Shellfish allergy  Home Medications   Prior to Admission medications   Medication Sig Start Date End Date Taking? Authorizing Provider  cephALEXin (KEFLEX) 500 MG capsule Take 1 capsule (500 mg total) by mouth 4 (four) times daily. 10/23/14   Robyn M Hess, PA-C   BP 123/83 mmHg  Pulse 110  Temp(Src) 98 F (36.7 C) (Oral)  Resp 18  Ht 6' (1.829 m)  Wt 151.501 kg  BMI 45.29 kg/m2  SpO2 98% Physical Exam    Constitutional: He appears well-developed and well-nourished.  HENT:  Head: Normocephalic and atraumatic.  Eyes: Conjunctivae are normal.  Neck: Normal range of motion. Neck supple.  Pulmonary/Chest: No respiratory distress.  Genitourinary:    Circumcised.  Neurological: He is alert.  Skin: Skin is warm and dry.  Psychiatric: He has a normal mood and affect.  Nursing note and vitals reviewed.   ED Course  Procedures (including critical care time) Labs Review Labs Reviewed - No data to display  Imaging Review No results found. I have personally reviewed and evaluated these images and lab results as part of my medical decision-making.   EKG Interpretation None       10:46 PM Patient seen and examined. Work-up initiated. Medications ordered.   Vital signs reviewed and are as follows: BP 123/83 mmHg  Pulse 110  Temp(Src) 98 F (36.7 C) (Oral)  Resp 18  Ht 6' (1.829 m)  Wt 151.501 kg  BMI 45.29 kg/m2  SpO2 98%  INCISION AND DRAINAGE Performed by: Carolee RotaGEIPLE,Srinivas Lippman S Consent: Verbal consent obtained. Risks and benefits: risks, benefits and alternatives were discussed Type: abscess  Body area: R groin  Anesthesia: local infiltration  Incision was made with a scalpel.  Local anesthetic: lidocaine 2% without epinephrine  Anesthetic total: 3 ml  Complexity: complex Blunt dissection to break up loculations  Drainage: purulent  Drainage amount: small  Packing material: none  Patient tolerance: Patient tolerated the procedure well with no immediate complications. Care  made to make small superficial incision into abscess pocket.    11:19 PM patient given first dose of Bactrim in emergency department. The patient was urged to return to the Emergency Department urgently with worsening pain, swelling, expanding erythema especially if it streaks away from the affected area, fever, or if they have any other concerns.   The patient was urged to return to the  Emergency Department or go to their PCP in 48 hours for wound recheck..  The patient verbalized understanding and stated agreement with this plan.      MDM   Final diagnoses:  Abscess of scrotal wall   Patient with abscess, already draining, easily accessible, amenable to incision and drainage. I feel like given location, warrants recheck in 2 days. Antibiotics given due to the location and some associated cellulitis. Will d/c to home.     Renne Crigler, PA-C 05/18/15 2321  Mirian Mo, MD 05/20/15 [phone]

## 2015-05-18 NOTE — ED Notes (Signed)
Presents with abscess to right groin area that popped and drained while at work this evening. Reports "a lot of drainage" denies pain now.  Concerned he may need antibiotics or may not have gotten all of it.

## 2015-09-02 ENCOUNTER — Emergency Department (HOSPITAL_BASED_OUTPATIENT_CLINIC_OR_DEPARTMENT_OTHER): Payer: Medicaid Other

## 2015-09-02 ENCOUNTER — Encounter (HOSPITAL_BASED_OUTPATIENT_CLINIC_OR_DEPARTMENT_OTHER): Payer: Self-pay | Admitting: *Deleted

## 2015-09-02 ENCOUNTER — Emergency Department (HOSPITAL_BASED_OUTPATIENT_CLINIC_OR_DEPARTMENT_OTHER)
Admission: EM | Admit: 2015-09-02 | Discharge: 2015-09-02 | Disposition: A | Discharge status: Home / Self Care | Payer: Medicaid Other | Attending: Emergency Medicine | Admitting: Emergency Medicine

## 2015-09-02 DIAGNOSIS — J45909 Unspecified asthma, uncomplicated: Secondary | ICD-10-CM | POA: Insufficient documentation

## 2015-09-02 DIAGNOSIS — J069 Acute upper respiratory infection, unspecified: Secondary | ICD-10-CM | POA: Insufficient documentation

## 2015-09-02 DIAGNOSIS — E119 Type 2 diabetes mellitus without complications: Secondary | ICD-10-CM | POA: Diagnosis not present

## 2015-09-02 DIAGNOSIS — Z8719 Personal history of other diseases of the digestive system: Secondary | ICD-10-CM | POA: Diagnosis not present

## 2015-09-02 DIAGNOSIS — Z87891 Personal history of nicotine dependence: Secondary | ICD-10-CM | POA: Insufficient documentation

## 2015-09-02 DIAGNOSIS — R6883 Chills (without fever): Secondary | ICD-10-CM | POA: Diagnosis present

## 2015-09-02 NOTE — ED Provider Notes (Signed)
CSN: [id number]     Arrival date & time 09/02/15  1934 History  By signing my name below, I, Tanda RockersMargaux Venter, attest that this documentation has been prepared under the direction and in the presence of Rolan BuccoMelanie Jennings Corado, MD. Electronically Signed: Tanda RockersMargaux Venter, ED Scribe. 09/02/2015. 10:16 PM.   Chief Complaint  Patient presents with  . Chills   The history is provided by the patient. No language interpreter was used.     HPI Comments: Benjamin Irwin is a 40 y.o. male who presents to the Emergency Department complaining of gradual onset, constant, chills x 2 days. Pt also complains of a cough and nasal congestion. He has not taken any OTC medication for his symptoms. Denies fever, nausea, vomiting, shortness of breath, leg swelling, body aches, or any other associated symptoms.   Past Medical History  Diagnosis Date  . Diabetes mellitus   . Asthma   . GERD (gastroesophageal reflux disease)    Past Surgical History  Procedure Laterality Date  . Cholecystectomy     History reviewed. No pertinent family history. Social History  Substance Use Topics  . Smoking status: Former Games developermoker  . Smokeless tobacco: None  . Alcohol Use: No    Review of Systems  Constitutional: Positive for chills. Negative for fever, diaphoresis and fatigue.  HENT: Positive for congestion. Negative for rhinorrhea and sneezing.   Eyes: Negative.   Respiratory: Positive for cough. Negative for chest tightness and shortness of breath.   Cardiovascular: Negative for chest pain and leg swelling.  Gastrointestinal: Negative for nausea, vomiting, abdominal pain, diarrhea and blood in stool.  Genitourinary: Negative for frequency, hematuria, flank pain and difficulty urinating.  Musculoskeletal: Negative for myalgias, back pain and arthralgias.  Skin: Negative for rash.  Neurological: Negative for dizziness, speech difficulty, weakness, numbness and headaches.      Allergies  Fish allergy and Shellfish  allergy  Home Medications   Prior to Admission medications   Not on File   BP 119/78 mmHg  Pulse 100  Temp(Src) 98.7 F (37.1 C) (Oral)  Resp 18  Ht 6' (1.829 m)  Wt 336 lb (152.409 kg)  BMI 45.56 kg/m2  SpO2 99%   Physical Exam  Constitutional: He is oriented to person, place, and time. He appears well-developed and well-nourished.  HENT:  Head: Normocephalic and atraumatic.  Right Ear: External ear normal.  Left Ear: External ear normal.  Mouth/Throat: Oropharynx is clear and moist.  Eyes: Pupils are equal, round, and reactive to light.  Neck: Normal range of motion. Neck supple.  Cardiovascular: Normal rate, regular rhythm and normal heart sounds.   Pulmonary/Chest: Effort normal and breath sounds normal. No respiratory distress. He has no wheezes. He has no rales. He exhibits no tenderness.  Abdominal: Soft. Bowel sounds are normal. There is no tenderness. There is no rebound and no guarding.  Musculoskeletal: Normal range of motion. He exhibits no edema.  Lymphadenopathy:    He has no cervical adenopathy.  Neurological: He is alert and oriented to person, place, and time.  Skin: Skin is warm and dry. No rash noted.  Psychiatric: He has a normal mood and affect.    ED Course  Procedures (including critical care time)  DIAGNOSTIC STUDIES: Oxygen Saturation is 99% on RA, normal by my interpretation.    COORDINATION OF CARE: 10:16 PM-Discussed treatment plan which includes CXR with pt at bedside and pt agreed to plan.   Labs Review Labs Reviewed - No data to display  Imaging Review Dg  Chest 2 View  09/02/2015  CLINICAL DATA:  Cough, congestion, fever for several days. EXAM: CHEST  2 VIEW COMPARISON:  Chest x-ray dated 06/09/2013. FINDINGS: Cardiomediastinal silhouette is normal in size and configuration. Lungs are clear. Lung volumes are normal. No evidence of pneumonia. No pleural effusion. No pneumothorax. Osseous and soft tissue structures about the chest are  unremarkable. IMPRESSION: No active cardiopulmonary disease. Electronically Signed   By: Bary Richard M.D.   On: 09/02/2015 22:49   I have personally reviewed and evaluated these images as part of my medical decision-making.   EKG Interpretation None      MDM   Final diagnoses:  URI (upper respiratory infection)   Patient presents with URI symptoms and chills. He's concerned about a pneumonia. He has no shortness of breath. His lungs are clear. There is no wheezing. He has no hypoxia. His chest x-ray is clear without evidence of pneumonia. He is otherwise well-appearing. He was discharged home in good condition. He was advised in symptomatic care. Return precautions were given.  I personally performed the services described in this documentation, which was scribed in my presence.  The recorded information has been reviewed and considered.      Rolan Bucco, MD 09/02/15 [phone]

## 2015-09-02 NOTE — ED Notes (Signed)
Congestion and chills x several days.  Denies other symptoms.

## 2015-09-02 NOTE — Discharge Instructions (Signed)
Upper Respiratory Infection, Adult Most upper respiratory infections (URIs) are a viral infection of the air passages leading to the lungs. A URI affects the nose, throat, and upper air passages. The most common type of URI is nasopharyngitis and is typically referred to as "the common cold." URIs run their course and usually go away on their own. Most of the time, a URI does not require medical attention, but sometimes a bacterial infection in the upper airways can follow a viral infection. This is called a secondary infection. Sinus and middle ear infections are common types of secondary upper respiratory infections. Bacterial pneumonia can also complicate a URI. A URI can worsen asthma and chronic obstructive pulmonary disease (COPD). Sometimes, these complications can require emergency medical care and may be life threatening.  CAUSES Almost all URIs are caused by viruses. A virus is a type of germ and can spread from one person to another.  RISKS FACTORS You may be at risk for a URI if:   You smoke.   You have chronic heart or lung disease.  You have a weakened defense (immune) system.   You are very young or very old.   You have nasal allergies or asthma.  You work in crowded or poorly ventilated areas.  You work in health care facilities or schools. SIGNS AND SYMPTOMS  Symptoms typically develop 2-3 days after you come in contact with a cold virus. Most viral URIs last 7-10 days. However, viral URIs from the influenza virus (flu virus) can last 14-18 days and are typically more severe. Symptoms may include:   Runny or stuffy (congested) nose.   Sneezing.   Cough.   Sore throat.   Headache.   Fatigue.   Fever.   Loss of appetite.   Pain in your forehead, behind your eyes, and over your cheekbones (sinus pain).  Muscle aches.  DIAGNOSIS  Your health care provider may diagnose a URI by:  Physical exam.  Tests to check that your symptoms are not due to  another condition such as:  Strep throat.  Sinusitis.  Pneumonia.  Asthma. TREATMENT  A URI goes away on its own with time. It cannot be cured with medicines, but medicines may be prescribed or recommended to relieve symptoms. Medicines may help:  Reduce your fever.  Reduce your cough.  Relieve nasal congestion. HOME CARE INSTRUCTIONS   Take medicines only as directed by your health care provider.   Gargle warm saltwater or take cough drops to comfort your throat as directed by your health care provider.  Use a warm mist humidifier or inhale steam from a shower to increase air moisture. This may make it easier to breathe.  Drink enough fluid to keep your urine clear or pale yellow.   Eat soups and other clear broths and maintain good nutrition.   Rest as needed.   Return to work when your temperature has returned to normal or as your health care provider advises. You may need to stay home longer to avoid infecting others. You can also use a face mask and careful hand washing to prevent spread of the virus.  Increase the usage of your inhaler if you have asthma.   Do not use any tobacco products, including cigarettes, chewing tobacco, or electronic cigarettes. If you need help quitting, ask your health care provider. PREVENTION  The best way to protect yourself from getting a cold is to practice good hygiene.   Avoid oral or hand contact with people with cold   symptoms.   Wash your hands often if contact occurs.  There is no clear evidence that vitamin C, vitamin E, echinacea, or exercise reduces the chance of developing a cold. However, it is always recommended to get plenty of rest, exercise, and practice good nutrition.  SEEK MEDICAL CARE IF:   You are getting worse rather than better.   Your symptoms are not controlled by medicine.   You have chills.  You have worsening shortness of breath.  You have brown or red mucus.  You have yellow or brown nasal  discharge.  You have pain in your face, especially when you bend forward.  You have a fever.  You have swollen neck glands.  You have pain while swallowing.  You have white areas in the back of your throat. SEEK IMMEDIATE MEDICAL CARE IF:   You have severe or persistent:  Headache.  Ear pain.  Sinus pain.  Chest pain.  You have chronic lung disease and any of the following:  Wheezing.  Prolonged cough.  Coughing up blood.  A change in your usual mucus.  You have a stiff neck.  You have changes in your:  Vision.  Hearing.  Thinking.  Mood. MAKE SURE YOU:   Understand these instructions.  Will watch your condition.  Will get help right away if you are not doing well or get worse.   This information is not intended to replace advice given to you by your health care provider. Make sure you discuss any questions you have with your health care provider.   Document Released: 12/02/2000 Document Revised: 10/23/2014 Document Reviewed: 09/13/2013 Elsevier Interactive Patient Education 2016 [employer]  

## 2017-06-22 ENCOUNTER — Emergency Department (HOSPITAL_BASED_OUTPATIENT_CLINIC_OR_DEPARTMENT_OTHER)
Admission: EM | Admit: 2017-06-22 | Discharge: 2017-06-23 | Disposition: A | Discharge status: Home / Self Care | Payer: Medicaid Other | Attending: Emergency Medicine | Admitting: Emergency Medicine

## 2017-06-22 ENCOUNTER — Other Ambulatory Visit: Payer: Self-pay

## 2017-06-22 ENCOUNTER — Emergency Department (HOSPITAL_BASED_OUTPATIENT_CLINIC_OR_DEPARTMENT_OTHER): Payer: Medicaid Other

## 2017-06-22 ENCOUNTER — Encounter (HOSPITAL_BASED_OUTPATIENT_CLINIC_OR_DEPARTMENT_OTHER): Payer: Self-pay | Admitting: Emergency Medicine

## 2017-06-22 DIAGNOSIS — E119 Type 2 diabetes mellitus without complications: Secondary | ICD-10-CM | POA: Insufficient documentation

## 2017-06-22 DIAGNOSIS — J45909 Unspecified asthma, uncomplicated: Secondary | ICD-10-CM | POA: Diagnosis not present

## 2017-06-22 DIAGNOSIS — Z87891 Personal history of nicotine dependence: Secondary | ICD-10-CM | POA: Insufficient documentation

## 2017-06-22 DIAGNOSIS — K921 Melena: Secondary | ICD-10-CM | POA: Insufficient documentation

## 2017-06-22 DIAGNOSIS — R1013 Epigastric pain: Secondary | ICD-10-CM | POA: Diagnosis not present

## 2017-06-22 HISTORY — DX: Irritable bowel syndrome, unspecified: K58.9

## 2017-06-22 LAB — OCCULT BLOOD X 1 CARD TO LAB, STOOL: Fecal Occult Bld: POSITIVE — AB

## 2017-06-22 LAB — CBC WITH DIFFERENTIAL/PLATELET
Basophils Absolute: 0 10*3/uL (ref 0.0–0.1)
Basophils Relative: 0 %
Eosinophils Absolute: 0.3 10*3/uL (ref 0.0–0.7)
Eosinophils Relative: 2 %
HEMATOCRIT: 40.8 % (ref 39.0–52.0)
HEMOGLOBIN: 13.5 g/dL (ref 13.0–17.0)
LYMPHS ABS: 4 10*3/uL (ref 0.7–4.0)
LYMPHS PCT: 31 %
MCH: 28.8 pg (ref 26.0–34.0)
MCHC: 33.1 g/dL (ref 30.0–36.0)
MCV: 87.2 fL (ref 78.0–100.0)
Monocytes Absolute: 0.9 10*3/uL (ref 0.1–1.0)
Monocytes Relative: 7 %
NEUTROS PCT: 60 %
Neutro Abs: 7.6 10*3/uL (ref 1.7–7.7)
Platelets: 253 10*3/uL (ref 150–400)
RBC: 4.68 MIL/uL (ref 4.22–5.81)
RDW: 14 % (ref 11.5–15.5)
WBC: 12.8 10*3/uL — AB (ref 4.0–10.5)

## 2017-06-22 LAB — COMPREHENSIVE METABOLIC PANEL
ALT: 19 U/L (ref 17–63)
AST: 13 U/L — ABNORMAL LOW (ref 15–41)
Albumin: 3.3 g/dL — ABNORMAL LOW (ref 3.5–5.0)
Alkaline Phosphatase: 96 U/L (ref 38–126)
Anion gap: 7 (ref 5–15)
BUN: 9 mg/dL (ref 6–20)
CHLORIDE: 99 mmol/L — AB (ref 101–111)
CO2: 27 mmol/L (ref 22–32)
Calcium: 8.6 mg/dL — ABNORMAL LOW (ref 8.9–10.3)
Creatinine, Ser: 0.74 mg/dL (ref 0.61–1.24)
Glucose, Bld: 245 mg/dL — ABNORMAL HIGH (ref 65–99)
POTASSIUM: 3.7 mmol/L (ref 3.5–5.1)
SODIUM: 133 mmol/L — AB (ref 135–145)
Total Bilirubin: 0.4 mg/dL (ref 0.3–1.2)
Total Protein: 7.2 g/dL (ref 6.5–8.1)

## 2017-06-22 LAB — PROTIME-INR
INR: 0.92
Prothrombin Time: 12.3 s (ref 11.4–15.2)

## 2017-06-22 MED ORDER — SODIUM CHLORIDE 0.9 % IV BOLUS (SEPSIS)
1000.0000 mL | Freq: Once | INTRAVENOUS | Status: AC
  Administered 2017-06-22: 1000 mL via INTRAVENOUS

## 2017-06-22 NOTE — ED Provider Notes (Signed)
[facility] EMERGENCY DEPARTMENT Provider Note   CSN: [id number] Arrival date & time: 06/22/17  1935     History   Chief Complaint Chief Complaint  Patient presents with  . Blood In Stools    HPI Benjamin Irwin is a 42 y.o. male past medical history of IBS, GERD who presents to ED for evaluation of 4 episodes of bright red blood in stool today.  He reports normal consistency of bowel movements with blood that has been intermittently mixed in and blood when he wipes.  No previous history of similar symptoms.  He reports increase in gas and slight epigastric abdominal comfort which is relieved when he passes flatulence.  Denies any nausea, vomiting, suspicious food ingestions, diarrhea, constipation, URI symptoms, chest pain.  He has never been evaluated by GI specialist and has not undergone a colonoscopy in the past.  He reports occasional alcohol use, moreso last night for New Years Eve.  HPI  Past Medical History:  Diagnosis Date  . Asthma   . Diabetes mellitus   . GERD (gastroesophageal reflux disease)   . IBS (irritable bowel syndrome)     There are no active problems to display for this patient.   Past Surgical History:  Procedure Laterality Date  . CHOLECYSTECTOMY         Home Medications    Prior to Admission medications   Medication Sig Start Date End Date Taking? Authorizing Provider  docusate sodium (COLACE) 100 MG capsule Take 1 capsule (100 mg total) by mouth every 12 (twelve) hours. 06/23/17   Dietrich Pates, PA-C    Family History No family history on file.  Social History Social History   Tobacco Use  . Smoking status: Former Smoker  Substance Use Topics  . Alcohol use: No  . Drug use: Yes    Frequency: 3.0 times per week    Types: Marijuana     Allergies   Fish allergy and Shellfish allergy   Review of Systems Review of Systems  Constitutional: Negative for appetite change, chills and fever.  HENT: Negative for ear pain,  rhinorrhea, sneezing and sore throat.   Eyes: Negative for photophobia and visual disturbance.  Respiratory: Negative for cough, chest tightness, shortness of breath and wheezing.   Cardiovascular: Negative for chest pain and palpitations.  Gastrointestinal: Positive for abdominal pain and blood in stool. Negative for constipation, diarrhea, nausea and vomiting.       + increase in flatulence  Genitourinary: Negative for dysuria, hematuria and urgency.  Musculoskeletal: Negative for myalgias.  Skin: Negative for rash.  Neurological: Negative for dizziness, weakness and light-headedness.     Physical Exam Updated Vital Signs BP (!) 130/96 (BP Location: Left Arm)   Pulse 82   Temp 98.5 F (36.9 C) (Oral)   Resp 16   SpO2 97%   Physical Exam  Constitutional: He appears well-developed and well-nourished. No distress.  Nontoxic appearing and in no acute distress.  HENT:  Head: Normocephalic and atraumatic.  Nose: Nose normal.  Eyes: Conjunctivae and EOM are normal. Left eye exhibits no discharge. No scleral icterus.  Neck: Normal range of motion. Neck supple.  Cardiovascular: Normal rate, regular rhythm, normal heart sounds and intact distal pulses. Exam reveals no gallop and no friction rub.  No murmur heard. Pulmonary/Chest: Effort normal and breath sounds normal. No respiratory distress.  Abdominal: Soft. Bowel sounds are normal. He exhibits no distension. There is tenderness. There is no guarding.    Genitourinary: Rectal exam shows guaiac  positive stool. Rectal exam shows no external hemorrhoid, no internal hemorrhoid, no fissure, no tenderness and anal tone normal.  Genitourinary Comments: Stool is not grossly melanotic.  Musculoskeletal: Normal range of motion. He exhibits no edema.  Neurological: He is alert. He exhibits normal muscle tone. Coordination normal.  Skin: Skin is warm and dry. No rash noted.  Psychiatric: He has a normal mood and affect.  Nursing note and  vitals reviewed.    ED Treatments / Results  Labs (all labs ordered are listed, but only abnormal results are displayed) Labs Reviewed  COMPREHENSIVE METABOLIC PANEL - Abnormal; Notable for the following components:      Result Value   Sodium 133 (*)    Chloride 99 (*)    Glucose, Bld 245 (*)    Calcium 8.6 (*)    Albumin 3.3 (*)    AST 13 (*)    All other components within normal limits  CBC WITH DIFFERENTIAL/PLATELET - Abnormal; Notable for the following components:   WBC 12.8 (*)    All other components within normal limits  OCCULT BLOOD X 1 CARD TO LAB, STOOL - Abnormal; Notable for the following components:   Fecal Occult Bld POSITIVE (*)    All other components within normal limits  PROTIME-INR  POC OCCULT BLOOD, ED    EKG  EKG Interpretation None       Radiology Ct Abdomen Pelvis W Contrast  Result Date: 06/23/2017 CLINICAL DATA:  Acute onset of hematochezia. EXAM: CT ABDOMEN AND PELVIS WITH CONTRAST TECHNIQUE: Multidetector CT imaging of the abdomen and pelvis was performed using the standard protocol following bolus administration of intravenous contrast. CONTRAST:  ISOVUE-300 IOPAMIDOL (ISOVUE-300) INJECTION 61% COMPARISON:  Abdominal radiograph performed 10/17/2013 FINDINGS: Lower chest: The visualized lung bases are grossly clear. The visualized portions of the mediastinum are unremarkable. Hepatobiliary: The liver is unremarkable in appearance. The patient is status post cholecystectomy, with clips noted at the gallbladder fossa. The common bile duct remains normal in caliber. Pancreas: The pancreas is within normal limits. Spleen: The spleen is unremarkable in appearance. Adrenals/Urinary Tract: The adrenal glands are unremarkable in appearance. The kidneys are within normal limits. There is no evidence of hydronephrosis. No renal or ureteral stones are identified. No perinephric stranding is seen. Stomach/Bowel: The stomach is unremarkable in appearance. The  small bowel is within normal limits. The appendix is normal in caliber, without evidence of appendicitis. Mild fatty infiltration of the wall of the ascending colon likely reflects sequelae of chronic inflammation. The colon is otherwise unremarkable in appearance. Vascular/Lymphatic: Minimal calcification is seen along the distal abdominal aorta. No retroperitoneal or pelvic sidewall lymphadenopathy is seen. Mildly prominent bilateral inguinal nodes measure up to 1.3 cm in short axis. Reproductive: The bladder is mildly distended and grossly unremarkable. The prostate remains normal in size. Other: No additional soft tissue abnormalities are seen. Musculoskeletal: No acute osseous abnormalities are identified. The visualized musculature is unremarkable in appearance. IMPRESSION: 1. No acute abnormality seen to explain the patient's symptoms. 2. Mild fatty infiltration of the wall of the ascending colon likely reflects sequelae of chronic inflammation. 3. Mildly prominent bilateral inguinal nodes measure up to 1.3 cm in short axis. Electronically Signed   By: Roanna Raider M.D.   On: 06/23/2017 01:36      Procedures Procedures (including critical care time)  Medications Ordered in ED Medications  sodium chloride 0.9 % bolus 1,000 mL (1,000 mLs Intravenous New Bag/Given 06/22/17 2132)  iopamidol (ISOVUE-300) 61 % injection  100 mL (100 mLs Intravenous Contrast Given 06/23/17 0102)     Initial Impression / Assessment and Plan / ED Course  I have reviewed the triage vital signs and the nursing notes.  Pertinent labs & imaging results that were available during my care of the patient were reviewed by me and considered in my medical decision making (see chart for details).     Patient, with a past medical history of IBS, GERD who presents to ED for evaluation of 4 episodes of bright red blood in stool today.  He reports normal consistency of bowel movements with blood that has been intermittently mixed  in blood when he wipes.  He has not been evaluated by GI.  He reports mild epigastric and lower abdominal discomfort that is relieved with passing flatulence.  He reports occasional alcohol use.  He denies any vomiting, diarrhea, constipation or chest discomfort.  Physical exam he is overall well-appearing.  He does have some tenderness to palpation of the abdomen as indicated in the image above.  No signs of hemorrhoids or anal fissures noted on physical examination of the rectum.  Stool is not grossly melanotic.  But it did return as guaiac positive.  Lab work including CBC, CMP unremarkable.  CT of the abdomen and pelvis showed no acute pathology or explanation of symptoms.  Suspect that this could be due to trauma to the site.  At this time I have low suspicion for acute life-threatening process being the cause of his symptoms based on his stable hemoglobin hematocrit and imaging findings.  Advised him to follow-up with GI specialist for further evaluation as he will most likely need a colonoscopy for further evaluation.  Will give Colace to be taken as needed for constipation to prevent any straining in the future.  He does not have any medications at home to help with his IBS.  Patient appears stable for discharge at this time.  Strict return precautions given.  Final Clinical Impressions(s) / ED Diagnoses   Final diagnoses:  Blood in stool    ED Discharge Orders        Ordered    docusate sodium (COLACE) 100 MG capsule  Every 12 hours     06/23/17 0144     Portions of this note were generated with Dragon dictation software. Dictation errors may occur despite best attempts at proofreading.    Dietrich PatesKhatri, Hezakiah Champeau, PA-C 06/23/17 0149    Pricilla LovelessGoldston, Scott, MD 06/25/17 1038

## 2017-06-22 NOTE — ED Triage Notes (Addendum)
Pt presents with c/o blood in stools today. PT states he had 4 BM's today and had blood in each one. PT states he was drinking a lot last night for new years and doesn't know if that is the cause of rectal bleeding.

## 2017-06-22 NOTE — ED Notes (Signed)
Pt. Has been spoken to about x ray vs. Ct Scan and CT scan vs. US by Radiology.  Pt. Will be spoken to by PA C.

## 2017-06-23 MED ORDER — IOPAMIDOL (ISOVUE-300) INJECTION 61%
100.0000 mL | Freq: Once | INTRAVENOUS | Status: AC | PRN
  Administered 2017-06-23: 100 mL via INTRAVENOUS

## 2017-06-23 MED ORDER — DOCUSATE SODIUM 100 MG PO CAPS: 100.0000 mg | ORAL_CAPSULE | Freq: Two times a day (BID) | ORAL | 0 refills | Status: AC

## 2017-06-23 NOTE — Discharge Instructions (Signed)
Please read the attached information regarding your condition. Follow-up with GI specialist for further evaluation, using information listed below. Continue your home medications for IBS as previously prescribed. Return to ED for worsening symptoms, severe abdominal pain, fevers, loss of consciousness.

## 2017-12-24 ENCOUNTER — Emergency Department (HOSPITAL_BASED_OUTPATIENT_CLINIC_OR_DEPARTMENT_OTHER)
Admission: EM | Admit: 2017-12-24 | Discharge: 2017-12-24 | Disposition: A | Discharge status: Home / Self Care | Payer: Medicaid Other | Attending: Emergency Medicine | Admitting: Emergency Medicine

## 2017-12-24 ENCOUNTER — Other Ambulatory Visit: Payer: Self-pay

## 2017-12-24 ENCOUNTER — Encounter (HOSPITAL_BASED_OUTPATIENT_CLINIC_OR_DEPARTMENT_OTHER): Payer: Self-pay | Admitting: Emergency Medicine

## 2017-12-24 DIAGNOSIS — E1165 Type 2 diabetes mellitus with hyperglycemia: Secondary | ICD-10-CM | POA: Insufficient documentation

## 2017-12-24 DIAGNOSIS — Z87891 Personal history of nicotine dependence: Secondary | ICD-10-CM | POA: Insufficient documentation

## 2017-12-24 DIAGNOSIS — Z9049 Acquired absence of other specified parts of digestive tract: Secondary | ICD-10-CM | POA: Insufficient documentation

## 2017-12-24 DIAGNOSIS — R739 Hyperglycemia, unspecified: Secondary | ICD-10-CM

## 2017-12-24 DIAGNOSIS — J45909 Unspecified asthma, uncomplicated: Secondary | ICD-10-CM | POA: Insufficient documentation

## 2017-12-24 LAB — COMPREHENSIVE METABOLIC PANEL
ALT: 14 U/L (ref 0–44)
AST: 14 U/L — ABNORMAL LOW (ref 15–41)
Albumin: 3.3 g/dL — ABNORMAL LOW (ref 3.5–5.0)
Alkaline Phosphatase: 96 U/L (ref 38–126)
Anion gap: 8 (ref 5–15)
BUN: 8 mg/dL (ref 6–20)
CO2: 25 mmol/L (ref 22–32)
Calcium: 8.4 mg/dL — ABNORMAL LOW (ref 8.9–10.3)
Chloride: 102 mmol/L (ref 98–111)
Creatinine, Ser: 0.76 mg/dL (ref 0.61–1.24)
GFR calc Af Amer: >60 mL/min (ref >60)
GFR calc non Af Amer: >60 mL/min (ref >60)
Glucose, Bld: 381 mg/dL — ABNORMAL HIGH (ref 70–99)
Potassium: 3.7 mmol/L (ref 3.5–5.1)
Sodium: 135 mmol/L (ref 135–145)
Total Bilirubin: 0.5 mg/dL (ref 0.3–1.2)
Total Protein: 7.3 g/dL (ref 6.5–8.1)

## 2017-12-24 LAB — CBC WITH DIFFERENTIAL/PLATELET
Basophils Absolute: 0 10*3/uL (ref 0.0–0.1)
Basophils Relative: 0 %
Eosinophils Absolute: 0.2 10*3/uL (ref 0.0–0.7)
Eosinophils Relative: 1 %
HCT: 40.1 % (ref 39.0–52.0)
Hemoglobin: 13.6 g/dL (ref 13.0–17.0)
Lymphocytes Relative: 28 %
Lymphs Abs: 3.1 10*3/uL (ref 0.7–4.0)
MCH: 29.2 pg (ref 26.0–34.0)
MCHC: 33.9 g/dL (ref 30.0–36.0)
MCV: 86.2 fL (ref 78.0–100.0)
Monocytes Absolute: 0.9 10*3/uL (ref 0.1–1.0)
Monocytes Relative: 8 %
Neutro Abs: 7.1 10*3/uL (ref 1.7–7.7)
Neutrophils Relative %: 63 %
Platelets: 247 10*3/uL (ref 150–400)
RBC: 4.65 MIL/uL (ref 4.22–5.81)
RDW: 13.4 % (ref 11.5–15.5)
WBC: 11.2 10*3/uL — ABNORMAL HIGH (ref 4.0–10.5)

## 2017-12-24 MED ORDER — METFORMIN HCL 500 MG PO TABS: 500.0000 mg | ORAL_TABLET | Freq: Two times a day (BID) | ORAL | 0 refills | Status: DC

## 2017-12-24 NOTE — ED Provider Notes (Signed)
[facility] EMERGENCY DEPARTMENT Provider Note   CSN: [id number] Arrival date & time: 12/24/17  1017     History   Chief Complaint Chief Complaint  Patient presents with  . Foot Swelling    HPI Benjamin Irwin is a 42 y.o. male with history of asthma, diabetes, GERD, IBS who presents with a 2-week history of lower extremity swelling.  Patient reports he recently changed post in his job and is now walking triple the amount of time.  He has had some pain related to the swelling of his shins to his knees laterally.  He denies any calf pain.  He denies any history of blood clots, recent long trips, surgeries, known cancer.  He denies any fevers, chest pain, shortness of breath, abdominal pain, nausea, vomiting.  Rest and elevation improves his symptoms.  He has not tried anything at home for his symptoms.  HPI  Past Medical History:  Diagnosis Date  . Asthma   . Diabetes mellitus   . GERD (gastroesophageal reflux disease)   . IBS (irritable bowel syndrome)     There are no active problems to display for this patient.   Past Surgical History:  Procedure Laterality Date  . CHOLECYSTECTOMY          Home Medications    Prior to Admission medications   Medication Sig Start Date End Date Taking? Authorizing Provider  docusate sodium (COLACE) 100 MG capsule Take 1 capsule (100 mg total) by mouth every 12 (twelve) hours. 06/23/17   Khatri, Hina, PA-C  metFORMIN (GLUCOPHAGE) 500 MG tablet Take 1 tablet (500 mg total) by mouth 2 (two) times daily with a meal. 12/24/17   Kyomi Hector, Waylan Boga, PA-C    Family History History reviewed. No pertinent family history.  Social History Social History   Tobacco Use  . Smoking status: Former Smoker  Substance Use Topics  . Alcohol use: No  . Drug use: Yes    Frequency: 3.0 times per week    Types: Marijuana     Allergies   Fish allergy and Shellfish allergy   Review of Systems Review of Systems  Constitutional:  Negative for chills and fever.  HENT: Negative for facial swelling and sore throat.   Respiratory: Negative for shortness of breath.   Cardiovascular: Negative for chest pain.  Gastrointestinal: Negative for abdominal pain, nausea and vomiting.  Genitourinary: Negative for dysuria.  Musculoskeletal: Positive for joint swelling and myalgias. Negative for back pain.  Skin: Negative for rash and wound.  Neurological: Negative for headaches.  Psychiatric/Behavioral: The patient is not nervous/anxious.      Physical Exam Updated Vital Signs BP 123/82 (BP Location: Left Arm)   Pulse 87   Temp 98.5 F (36.9 C) (Oral)   Resp 16   Ht 5\' 11"  (1.803 m)   Wt (!) 149.7 kg (330 lb)   SpO2 100%   BMI 46.03 kg/m   Physical Exam  Constitutional: He appears well-developed and well-nourished. No distress.  HENT:  Head: Normocephalic and atraumatic.  Mouth/Throat: Oropharynx is clear and moist. No oropharyngeal exudate.  Eyes: Pupils are equal, round, and reactive to light. Conjunctivae are normal. Right eye exhibits no discharge. Left eye exhibits no discharge. No scleral icterus.  Neck: Normal range of motion. Neck supple. No thyromegaly present.  Cardiovascular: Normal rate, regular rhythm, normal heart sounds and intact distal pulses. Exam reveals no gallop and no friction rub.  No murmur heard. Pulmonary/Chest: Effort normal and breath sounds normal. No stridor. No  respiratory distress. He has no wheezes. He has no rales.  Abdominal: Soft. Bowel sounds are normal. He exhibits no distension. There is no tenderness. There is no rebound and no guarding.  Musculoskeletal: He exhibits edema (1+ pitting edema to bilateral ankles).  Lymphadenopathy:    He has no cervical adenopathy.  Neurological: He is alert. Coordination normal.  Skin: Skin is warm and dry. No rash noted. He is not diaphoretic. No pallor.  Psychiatric: He has a normal mood and affect.  Nursing note and vitals  reviewed.    ED Treatments / Results  Labs (all labs ordered are listed, but only abnormal results are displayed) Labs Reviewed  COMPREHENSIVE METABOLIC PANEL - Abnormal; Notable for the following components:      Result Value   Glucose, Bld 381 (*)    Calcium 8.4 (*)    Albumin 3.3 (*)    AST 14 (*)    All other components within normal limits  CBC WITH DIFFERENTIAL/PLATELET - Abnormal; Notable for the following components:   WBC 11.2 (*)    All other components within normal limits    EKG None  Radiology No results found.  Procedures Procedures (including critical care time)  Medications Ordered in ED Medications - No data to display   Initial Impression / Assessment and Plan / ED Course  I have reviewed the triage vital signs and the nursing notes.  Pertinent labs & imaging results that were available during my care of the patient were reviewed by me and considered in my medical decision making (see chart for details).     Patient with a 2-week history of bilateral lower extremity edema after switching jobs and walking 3 times more than he used to.  Labs are unremarkable except for elevated glucose, 381 today.  Renal and liver function are within normal limits.  Low suspicion for heart failure or DVT as cause.  Supportive treatment discussed including elevation and compression stockings.  Will initiate metformin for patient's uncontrolled diabetes.  Patient advised to follow-up with his PCP for further management of these issues.  Return precautions discussed.  Patient understands and agrees with plan.  Patient vitals stable throughout ED course and discharged in satisfactory condition. I discussed patient case with Dr. Anitra LauthPlunkett who guided the patient's management and agrees with plan.   Final Clinical Impressions(s) / ED Diagnoses   Final diagnoses:  Hyperglycemia    ED Discharge Orders        Ordered    metFORMIN (GLUCOPHAGE) 500 MG tablet  2 times daily with  meals     12/24/17 1210       Emi HolesLaw, Timothea Bodenheimer M, PA-C 12/24/17 1658    Gwyneth SproutPlunkett, Whitney, MD 12/28/17 1342

## 2017-12-24 NOTE — ED Triage Notes (Addendum)
Reports bilateral feet swelling since starting a new position at work and having to walk more.  States that they have been swelling since 12/12/17.  States that he has difficulty walking due to the swelling.

## 2017-12-24 NOTE — Discharge Instructions (Signed)
Begin taking metformin for your high blood sugars.  Use compression stockings and elevate your feet whenever you are able.  Please follow-up with your doctor for further evaluation and treatment of your diabetes as well as leg and foot swelling.  Please return to the emergency department if you develop any new or worsening symptoms including chest pain, shortness of breath.

## 2018-07-17 ENCOUNTER — Encounter (HOSPITAL_BASED_OUTPATIENT_CLINIC_OR_DEPARTMENT_OTHER): Payer: Self-pay | Admitting: Emergency Medicine

## 2018-07-17 ENCOUNTER — Emergency Department (HOSPITAL_BASED_OUTPATIENT_CLINIC_OR_DEPARTMENT_OTHER)
Admission: EM | Admit: 2018-07-17 | Discharge: 2018-07-17 | Disposition: A | Discharge status: Home / Self Care | Payer: Medicaid Other | Attending: Emergency Medicine | Admitting: Emergency Medicine

## 2018-07-17 ENCOUNTER — Other Ambulatory Visit: Payer: Self-pay

## 2018-07-17 DIAGNOSIS — E119 Type 2 diabetes mellitus without complications: Secondary | ICD-10-CM | POA: Insufficient documentation

## 2018-07-17 DIAGNOSIS — B356 Tinea cruris: Secondary | ICD-10-CM | POA: Insufficient documentation

## 2018-07-17 DIAGNOSIS — L02214 Cutaneous abscess of groin: Secondary | ICD-10-CM

## 2018-07-17 DIAGNOSIS — Z87891 Personal history of nicotine dependence: Secondary | ICD-10-CM | POA: Insufficient documentation

## 2018-07-17 DIAGNOSIS — J45909 Unspecified asthma, uncomplicated: Secondary | ICD-10-CM | POA: Insufficient documentation

## 2018-07-17 MED ORDER — DOXYCYCLINE HYCLATE 100 MG PO TABS
100.0000 mg | ORAL_TABLET | Freq: Once | ORAL | Status: AC
  Administered 2018-07-17: 100 mg via ORAL
  Filled 2018-07-17: qty 1

## 2018-07-17 MED ORDER — DOXYCYCLINE HYCLATE 100 MG PO CAPS: 100.0000 mg | ORAL_CAPSULE | Freq: Two times a day (BID) | ORAL | 0 refills | Status: DC

## 2018-07-17 NOTE — ED Triage Notes (Signed)
C/o r sided groin abscess. Pt states it has been draining at home but he feels it has hardened. Denies fevers or other sx.

## 2018-07-17 NOTE — ED Provider Notes (Signed)
[facility] Provider Note: Lowella DellJ. Lane Geraldyn Shain, MD, FACEP  CSN: [id number] MRN: [id number] ARRIVAL: 07/17/18 at 0421 ROOM: [facility]04/[facility]04   CHIEF COMPLAINT  Abscess   HISTORY OF PRESENT ILLNESS  07/17/18 4:43 AM Benjamin Irwin is a 43 y.o. male who is had a tender, swollen area of his right groin fold for about the last week.  It has been draining serous fluid.  It has become indurated and he is concerned he may need antibiotics.  Associated pain is mild.  He is noted no purulent drainage.  He does have a history of similar lesions in the past.   Past Medical History:  Diagnosis Date  . Asthma   . Diabetes mellitus   . GERD (gastroesophageal reflux disease)   . IBS (irritable bowel syndrome)     Past Surgical History:  Procedure Laterality Date  . CHOLECYSTECTOMY      No family history on file.  Social History   Tobacco Use  . Smoking status: Former Games developermoker  . Smokeless tobacco: Never Used  Substance Use Topics  . Alcohol use: No  . Drug use: Yes    Frequency: 3.0 times per week    Types: Marijuana    Comment: occasional    Prior to Admission medications   Medication Sig Start Date End Date Taking? Authorizing Provider  docusate sodium (COLACE) 100 MG capsule Take 1 capsule (100 mg total) by mouth every 12 (twelve) hours. 06/23/17   Khatri, Hina, PA-C  metFORMIN (GLUCOPHAGE) 500 MG tablet Take 1 tablet (500 mg total) by mouth 2 (two) times daily with a meal. 12/24/17   Law, Waylan BogaAlexandra M, PA-C    Allergies Fish allergy and Shellfish allergy   REVIEW OF SYSTEMS  Negative except as noted here or in the History of Present Illness.   PHYSICAL EXAMINATION  Initial Vital Signs Blood pressure 125/80, pulse (!) 106, temperature 98.5 F (36.9 C), resp. rate 19, height 5\' 11"  (1.803 m), weight (!) 142.9 kg, SpO2 100 %.  Examination General: Well-developed, well-nourished male in no acute distress; appearance consistent with age of record HENT: normocephalic;  atraumatic Eyes: Normal appearance Neck: supple Heart: regular rate and rhythm Lungs: clear to auscultation bilaterally Abdomen: soft; nondistended; nontender; bowel sounds present GU: Normal external genitalia; tender, indurated area of right groin fold with serous drainage, no fluctuance Extremities: No deformity; full range of motion Neurologic: Awake, alert and oriented; motor function intact in all extremities and symmetric; no facial droop Skin: Warm and dry; chronic appearing skin changes of both groin folds Psychiatric: Normal mood and affect   RESULTS  Summary of this visit's results, reviewed by myself:   EKG Interpretation  Date/Time:    Ventricular Rate:    PR Interval:    QRS Duration:   QT Interval:    QTC Calculation:   R Axis:     Text Interpretation:        Laboratory Studies: No results found for this or any previous visit (from the past 24 hour(s)). Imaging Studies: No results found.  ED COURSE and MDM  Nursing notes and initial vitals signs, including pulse oximetry, reviewed.  Vitals:   07/17/18 0429 07/17/18 0432  BP:  125/80  Pulse:  (!) 106  Resp:  19  Temp:  98.5 F (36.9 C)  SpO2:  100%  Weight: (!) 142.9 kg   Height: 5\' 11"  (1.803 m)    Will place patient on doxycycline for likely abscess.  I&D does not appear indicated at this  time as there is no fluctuance and the drainage is only serous.  He was advised to use over-the-counter antifungal preparation for his tinea cruris, such as clotrimazole or tolnaftate.  PROCEDURES    ED DIAGNOSES     ICD-10-CM   1. Abscess of right groin L02.214   2. Tinea cruris B35.6        Dewain Platz, MD 07/17/18 [phone]

## 2018-09-29 ENCOUNTER — Other Ambulatory Visit (HOSPITAL_COMMUNITY): Payer: Self-pay | Admitting: Family Medicine

## 2018-09-29 MED ORDER — SULFAMETHOXAZOLE-TRIMETHOPRIM 800-160 MG PO TABS: 1.0000 | ORAL_TABLET | Freq: Two times a day (BID) | ORAL | 0 refills | Status: AC

## 2018-09-29 NOTE — Progress Notes (Signed)
Patient reports possible bug bites on back of neck x 3 days. Hx of DM2. Will start on bactrim DS bid x 14 days.

## 2019-04-14 ENCOUNTER — Emergency Department (HOSPITAL_BASED_OUTPATIENT_CLINIC_OR_DEPARTMENT_OTHER): Payer: Self-pay

## 2019-04-14 ENCOUNTER — Emergency Department (HOSPITAL_BASED_OUTPATIENT_CLINIC_OR_DEPARTMENT_OTHER)
Admission: EM | Admit: 2019-04-14 | Discharge: 2019-04-14 | Disposition: A | Discharge status: Home / Self Care | Payer: Self-pay | Attending: Emergency Medicine | Admitting: Emergency Medicine

## 2019-04-14 ENCOUNTER — Encounter (HOSPITAL_BASED_OUTPATIENT_CLINIC_OR_DEPARTMENT_OTHER): Payer: Self-pay

## 2019-04-14 ENCOUNTER — Other Ambulatory Visit: Payer: Self-pay

## 2019-04-14 DIAGNOSIS — Z87891 Personal history of nicotine dependence: Secondary | ICD-10-CM | POA: Insufficient documentation

## 2019-04-14 DIAGNOSIS — Y929 Unspecified place or not applicable: Secondary | ICD-10-CM | POA: Insufficient documentation

## 2019-04-14 DIAGNOSIS — E119 Type 2 diabetes mellitus without complications: Secondary | ICD-10-CM | POA: Insufficient documentation

## 2019-04-14 DIAGNOSIS — W2209XA Striking against other stationary object, initial encounter: Secondary | ICD-10-CM | POA: Insufficient documentation

## 2019-04-14 DIAGNOSIS — S9031XA Contusion of right foot, initial encounter: Secondary | ICD-10-CM | POA: Insufficient documentation

## 2019-04-14 DIAGNOSIS — J45909 Unspecified asthma, uncomplicated: Secondary | ICD-10-CM | POA: Insufficient documentation

## 2019-04-14 DIAGNOSIS — Y939 Activity, unspecified: Secondary | ICD-10-CM | POA: Insufficient documentation

## 2019-04-14 DIAGNOSIS — Y999 Unspecified external cause status: Secondary | ICD-10-CM | POA: Insufficient documentation

## 2019-04-14 DIAGNOSIS — Z91013 Allergy to seafood: Secondary | ICD-10-CM | POA: Insufficient documentation

## 2019-04-14 NOTE — Discharge Instructions (Signed)
Please read and follow all provided instructions.  Your diagnoses today include:  1. Contusion of left foot, initial encounter     Tests performed today include:  An x-ray of the affected area - does NOT show any broken bones  Vital signs. See below for your results today.   Medications prescribed:   None  Take any prescribed medications only as directed.  Home care instructions:   Follow any educational materials contained in this packet  Follow R.I.C.E. Protocol:  R - rest your injury   I  - use ice on injury without applying directly to skin  C - compress injury with bandage or splint  E - elevate the injury as much as possible  Follow-up instructions: Please follow-up with your primary care provider or the provided orthopedic physician (bone specialist) if you continue to have significant pain in 2 weeks.  Return instructions:   Please return if your toes or feet are numb or tingling, appear gray or blue, or you have severe pain (also elevate the leg and loosen splint or wrap if you were given one)  Please return to the Emergency Department if you experience worsening symptoms.   Please return if you have any other emergent concerns.  Additional Information:  Your vital signs today were: BP (!) 154/91 (BP Location: Left Arm)    Pulse 90    Temp 98.1 F (36.7 C) (Oral)    Resp 18    Ht 6' (1.829 m)    Wt 133.4 kg    BMI 39.87 kg/m  If your blood pressure (BP) was elevated above 135/85 this visit, please have this repeated by your doctor within one month. --------------

## 2019-04-14 NOTE — ED Triage Notes (Signed)
Pt c/o right foot injury 3 days ago-NAD-steady gait

## 2019-04-14 NOTE — ED Provider Notes (Signed)
[facility] EMERGENCY DEPARTMENT Provider Note   CSN: [id number] Arrival date & time: 04/14/19  1323     History   Chief Complaint Chief Complaint  Patient presents with  . Foot Injury    HPI Benjamin Irwin is a 43 y.o. male.     Patient presents with c/o R foot pain, acute onset, after striking a wall 3 days ago.  Patient states that he was worried about swelling of the foot and was concerned that he had broken a bone.  He has been able to walk on it.  He has been elevating the foot with improvement in swelling.  Pain is worse with movement.  No knee or hip pain.  No treatments otherwise prior to arrival.     Past Medical History:  Diagnosis Date  . Asthma   . Diabetes mellitus   . GERD (gastroesophageal reflux disease)   . IBS (irritable bowel syndrome)     There are no active problems to display for this patient.   Past Surgical History:  Procedure Laterality Date  . CHOLECYSTECTOMY          Home Medications    Prior to Admission medications   Medication Sig Start Date End Date Taking? Authorizing Provider  docusate sodium (COLACE) 100 MG capsule Take 1 capsule (100 mg total) by mouth every 12 (twelve) hours. 06/23/17   Dietrich Pates, PA-C    Family History No family history on file.  Social History Social History   Tobacco Use  . Smoking status: Former Games developer  . Smokeless tobacco: Never Used  Substance Use Topics  . Alcohol use: No  . Drug use: Yes    Types: Marijuana     Allergies   Fish allergy and Shellfish allergy   Review of Systems Review of Systems  Constitutional: Negative for activity change.  Musculoskeletal: Positive for arthralgias and joint swelling. Negative for back pain and gait problem.  Skin: Negative for wound.  Neurological: Negative for weakness and numbness.     Physical Exam Updated Vital Signs BP (!) 154/91 (BP Location: Left Arm)   Pulse 90   Temp 98.1 F (36.7 C) (Oral)   Resp 18   Ht 6'  (1.829 m)   Wt 133.4 kg   BMI 39.87 kg/m   Physical Exam Vitals signs and nursing note reviewed.  Constitutional:      Appearance: He is well-developed.  HENT:     Head: Normocephalic and atraumatic.  Eyes:     Conjunctiva/sclera: Conjunctivae normal.  Neck:     Musculoskeletal: Normal range of motion and neck supple.  Cardiovascular:     Pulses: Normal pulses. No decreased pulses.  Musculoskeletal:        General: Tenderness present.     Right ankle: Normal.     Right foot: Normal range of motion. Tenderness present. No bony tenderness.       Feet:  Skin:    General: Skin is warm and dry.  Neurological:     Mental Status: He is alert.     Sensory: No sensory deficit.     Comments: Motor, sensation, and vascular distal to the injury is fully intact.       ED Treatments / Results  Labs (all labs ordered are listed, but only abnormal results are displayed) Labs Reviewed - No data to display  EKG None  Radiology Dg Foot Complete Right  Result Date: 04/14/2019 CLINICAL DATA:  Acute right foot pain after injury 3 days ago.  EXAM: RIGHT FOOT COMPLETE - 3+ VIEW COMPARISON:  December 10, 2012. FINDINGS: There is no evidence of fracture or dislocation. There is no evidence of arthropathy or other focal bone abnormality. Soft tissues are unremarkable. IMPRESSION: Negative. Electronically Signed   By: Marijo Conception M.D.   On: 04/14/2019 13:54    Procedures Procedures (including critical care time)  Medications Ordered in ED Medications - No data to display   Initial Impression / Assessment and Plan / ED Course  I have reviewed the triage vital signs and the nursing notes.  Pertinent labs & imaging results that were available during my care of the patient were reviewed by me and considered in my medical decision making (see chart for details).        Patient seen and examined.  Patient informed of x-ray results.  Discussed continued rice protocol.  No indication for  crutches.  Encourage PCP follow-up if continued pain in 2 weeks.  Encouraged use of NSAIDs or Tylenol as needed for pain.  Vital signs reviewed and are as follows: BP (!) 154/91 (BP Location: Left Arm)   Pulse 90   Temp 98.1 F (36.7 C) (Oral)   Resp 18   Ht 6' (1.829 m)   Wt 133.4 kg   BMI 39.87 kg/m     Final Clinical Impressions(s) / ED Diagnoses   Final diagnoses:  Contusion of left foot, initial encounter   Patient with left foot contusion, negative x-rays.  Foot is neurovascularly intact.  No wounds.  ED Discharge Orders    None       Carlisle Cater, PA-C 04/14/19 [place], Wenda Overland, MD 04/14/19 1459

## 2019-07-21 ENCOUNTER — Emergency Department (HOSPITAL_BASED_OUTPATIENT_CLINIC_OR_DEPARTMENT_OTHER)
Admission: EM | Admit: 2019-07-21 | Discharge: 2019-07-21 | Discharge status: Against Medical Advice | Payer: Medicaid Other | Attending: Emergency Medicine | Admitting: Emergency Medicine

## 2019-07-21 ENCOUNTER — Encounter (HOSPITAL_BASED_OUTPATIENT_CLINIC_OR_DEPARTMENT_OTHER): Payer: Self-pay

## 2019-07-21 ENCOUNTER — Other Ambulatory Visit: Payer: Self-pay

## 2019-07-21 DIAGNOSIS — J45909 Unspecified asthma, uncomplicated: Secondary | ICD-10-CM | POA: Insufficient documentation

## 2019-07-21 DIAGNOSIS — E119 Type 2 diabetes mellitus without complications: Secondary | ICD-10-CM | POA: Insufficient documentation

## 2019-07-21 DIAGNOSIS — N492 Inflammatory disorders of scrotum: Secondary | ICD-10-CM | POA: Insufficient documentation

## 2019-07-21 DIAGNOSIS — L02214 Cutaneous abscess of groin: Secondary | ICD-10-CM

## 2019-07-21 DIAGNOSIS — Z87891 Personal history of nicotine dependence: Secondary | ICD-10-CM | POA: Insufficient documentation

## 2019-07-21 NOTE — Discharge Instructions (Signed)
As discussed, we strongly recommend further medical workup for evaluation of your infection.  Return to the ER immediately once you have addressed your home/work obligations that prevented you from staying for further evaluation today. It is recommended you go to [facility] ER, as they have both urology and general surgery specialists should your care require those services.

## 2019-07-21 NOTE — ED Triage Notes (Signed)
Pt c/o intermittent groin abscess x 2 weeks-NAD-steady gait

## 2019-07-21 NOTE — ED Provider Notes (Signed)
[place] EMERGENCY DEPARTMENT Provider Note   CSN: [id number] Arrival date & time: 07/21/19  1200     History Chief Complaint  Patient presents with  . Abscess    Benjamin Irwin is a 44 y.o. male with past medical history of type 2 diabetes, presenting to the emergency department with right scrotal abscess.  He states he initially noticed it 2 weeks ago and thought it was getting better, however it began worsening.  He states he has had recurrence of this in the same area though this occasion it is larger than any previous times.  He denies known history of MRSA infection.  He denies fevers or chills.  He states his blood sugars are well controlled with diet and exercise and has lost quite a bit of weight.  He denies abdominal pain or pain in his rectum.  He has tried warm compresses and it has been draining lots of fluid.  The history is provided by the patient and medical records.       Past Medical History:  Diagnosis Date  . Asthma   . Diabetes mellitus   . GERD (gastroesophageal reflux disease)   . IBS (irritable bowel syndrome)     There are no problems to display for this patient.   Past Surgical History:  Procedure Laterality Date  . CHOLECYSTECTOMY         No family history on file.  Social History   Tobacco Use  . Smoking status: Former Research scientist (life sciences)  . Smokeless tobacco: Never Used  Substance Use Topics  . Alcohol use: No  . Drug use: Yes    Types: Marijuana    Home Medications Prior to Admission medications   Medication Sig Start Date End Date Taking? Authorizing Provider  docusate sodium (COLACE) 100 MG capsule Take 1 capsule (100 mg total) by mouth every 12 (twelve) hours. 06/23/17   Khatri, Hina, PA-C    Allergies    Fish allergy and Shellfish allergy  Review of Systems   Review of Systems  All other systems reviewed and are negative.   Physical Exam Updated Vital Signs BP (!) 143/94 (BP Location: Left Arm)   Pulse (!) 114    Temp 98.8 F (37.1 C) (Oral)   Resp 16   Ht 6' (1.829 m)   Wt 129.8 kg   SpO2 100%   BMI 38.82 kg/m   Physical Exam Vitals and nursing note reviewed.  Constitutional:      General: He is not in acute distress.    Appearance: He is well-developed. He is obese. He is not ill-appearing.  HENT:     Head: Normocephalic and atraumatic.  Eyes:     Conjunctiva/sclera: Conjunctivae normal.  Cardiovascular:     Rate and Rhythm: Normal rate.  Pulmonary:     Effort: Pulmonary effort is normal.  Abdominal:     Palpations: Abdomen is soft.  Genitourinary:    Comments: Large fluctuant abscess to right lateral scrotal wall. Draining yellow/brownish fluid. Induration surrounding area extending along lateral aspect to posterior scrotum. Unclear if testicle is enlarged. Induration tracks towards perineal region bw scrotum and rectum. Skin:    General: Skin is warm.  Neurological:     Mental Status: He is alert.  Psychiatric:        Behavior: Behavior normal.     ED Results / Procedures / Treatments   Labs (all labs ordered are listed, but only abnormal results are displayed) Labs Reviewed - No data to display  EKG None  Radiology No results found.  Procedures Procedures (including critical care time)  Medications Ordered in ED Medications - No data to display  ED Course  I have reviewed the triage vital signs and the nursing notes.  Pertinent labs & imaging results that were available during my care of the patient were reviewed by me and considered in my medical decision making (see chart for details).    MDM Rules/Calculators/A&P                      Pt with T2DM, presenting with recurrent groin abscess. Pt denies F/C or systemic symptoms and has been managing his DM with diet and exercise.  On evaluation he is slightly tachycardic though afebrile.  Exam of the groin reveals large fluctuant abscess to the right scrotum with induration tracking towards the perineum.  Concern  for possible deep space involvement.  Patient discussed with and evaluated by Dr. Donnald Garre.  Feel he needs further work-up including labs and imaging, however patient states he has obligations at home and work he needs to tend to before he can take the time for further work-up.  Patient will be discharged AGAINST MEDICAL ADVICE though seems reliable for return follow-up for further evaluation and management.  He is instructed to report to [facility] long ED, should he need general surgery versus urology intervention once work-up is completed.  Stressed the importance of further management and medical care, patient verbalized understanding.  He understands the risks of leaving AGAINST MEDICAL ADVICE.  Final Clinical Impression(s) / ED Diagnoses Final diagnoses:  Abscess of right groin    Rx / DC Orders ED Discharge Orders    None       Zeynab Klett, [place] N, PA-C 07/21/19 1443    Arby Barrette, MD 07/23/19 [phone]

## 2019-07-21 NOTE — ED Provider Notes (Signed)
Medical screening examination/treatment/procedure(s) were conducted as a shared visit with non-physician practitioner(s) and myself.  I personally evaluated the patient during the encounter.    Patient presents with a abscess developing on the scrotum.  Initial onset about 2 weeks ago.  It has been worsening and enlarging.  Patient is a type II diabetic but has not been getting any specific treatment.  He has been controlling his symptoms by diet and exercise but not had any recent monitoring.  Patient is alert and nontoxic.  Clear mental status.  Abdomen is soft and nontender without guarding.  Large area of scrotal involvement on the right side.  There is a more fluctuant abscess focus about a couple of centimeters on the more proximal scrotum.  He however also has a fairly large swath of indurated and thickened deeper tissue.  Left testicle is easily isolated and smooth and nontender however right testicle not is clearly mobile and free from this thickened induration.  I have concern for patient with unmonitored diabetes.  Scrotal abscess is complex on clinical exam.  Patient will need further diagnostic and imaging studies to determine extent of involvement and any possible need for specialty consultation.  At this time, he has work-related commitments that he must resolve at home before further work-up in the emergency department.  Patient is advised to return for evaluation.  He is counseled to return to [facility] long emergency department in the event he should need consultation to urology once diagnostic evaluation completed.   Arby Barrette, MD 07/23/19 [phone]

## 2019-07-22 ENCOUNTER — Other Ambulatory Visit: Payer: Self-pay

## 2019-07-22 ENCOUNTER — Encounter (HOSPITAL_COMMUNITY): Payer: Self-pay | Admitting: Emergency Medicine

## 2019-07-22 ENCOUNTER — Emergency Department (HOSPITAL_COMMUNITY): Payer: Medicaid Other

## 2019-07-22 ENCOUNTER — Emergency Department (HOSPITAL_COMMUNITY)
Admission: EM | Admit: 2019-07-22 | Discharge: 2019-07-22 | Disposition: A | Discharge status: Home / Self Care | Payer: Medicaid Other | Attending: Emergency Medicine | Admitting: Emergency Medicine

## 2019-07-22 DIAGNOSIS — J45909 Unspecified asthma, uncomplicated: Secondary | ICD-10-CM | POA: Insufficient documentation

## 2019-07-22 DIAGNOSIS — E119 Type 2 diabetes mellitus without complications: Secondary | ICD-10-CM | POA: Insufficient documentation

## 2019-07-22 DIAGNOSIS — N5089 Other specified disorders of the male genital organs: Secondary | ICD-10-CM | POA: Insufficient documentation

## 2019-07-22 DIAGNOSIS — Z87891 Personal history of nicotine dependence: Secondary | ICD-10-CM | POA: Insufficient documentation

## 2019-07-22 LAB — COMPREHENSIVE METABOLIC PANEL
ALT: 13 U/L (ref 0–44)
AST: 11 U/L — ABNORMAL LOW (ref 15–41)
Albumin: 3.5 g/dL (ref 3.5–5.0)
Alkaline Phosphatase: 92 U/L (ref 38–126)
Anion gap: 10 (ref 5–15)
BUN: 10 mg/dL (ref 6–20)
CO2: 28 mmol/L (ref 22–32)
Calcium: 9.3 mg/dL (ref 8.9–10.3)
Chloride: 97 mmol/L — ABNORMAL LOW (ref 98–111)
Creatinine, Ser: 0.78 mg/dL (ref 0.61–1.24)
GFR calc Af Amer: >60 mL/min (ref >60)
GFR calc non Af Amer: >60 mL/min (ref >60)
Glucose, Bld: 349 mg/dL — ABNORMAL HIGH (ref 70–99)
Potassium: 4 mmol/L (ref 3.5–5.1)
Sodium: 135 mmol/L (ref 135–145)
Total Bilirubin: 0.3 mg/dL (ref 0.3–1.2)
Total Protein: 7.8 g/dL (ref 6.5–8.1)

## 2019-07-22 LAB — CBC WITH DIFFERENTIAL/PLATELET
Abs Immature Granulocytes: 0.04 10*3/uL (ref 0.00–0.07)
Basophils Absolute: 0 10*3/uL (ref 0.0–0.1)
Basophils Relative: 0 %
Eosinophils Absolute: 0.2 10*3/uL (ref 0.0–0.5)
Eosinophils Relative: 2 %
HCT: 43.3 % (ref 39.0–52.0)
Hemoglobin: 14.2 g/dL (ref 13.0–17.0)
Immature Granulocytes: 0 %
Lymphocytes Relative: 32 %
Lymphs Abs: 4.2 10*3/uL — ABNORMAL HIGH (ref 0.7–4.0)
MCH: 28.8 pg (ref 26.0–34.0)
MCHC: 32.8 g/dL (ref 30.0–36.0)
MCV: 87.8 fL (ref 80.0–100.0)
Monocytes Absolute: 1 10*3/uL (ref 0.1–1.0)
Monocytes Relative: 8 %
Neutro Abs: 7.8 10*3/uL — ABNORMAL HIGH (ref 1.7–7.7)
Neutrophils Relative %: 58 %
Platelets: 297 10*3/uL (ref 150–400)
RBC: 4.93 MIL/uL (ref 4.22–5.81)
RDW: 13.6 % (ref 11.5–15.5)
WBC: 13.3 10*3/uL — ABNORMAL HIGH (ref 4.0–10.5)
nRBC: 0 % (ref 0.0–0.2)

## 2019-07-22 LAB — LACTIC ACID, PLASMA: Lactic Acid, Venous: 1.8 mmol/L (ref 0.5–1.9)

## 2019-07-22 MED ORDER — SODIUM CHLORIDE 0.9 % IV BOLUS
1000.0000 mL | Freq: Once | INTRAVENOUS | Status: AC
  Administered 2019-07-22: 1000 mL via INTRAVENOUS

## 2019-07-22 MED ORDER — DOXYCYCLINE HYCLATE 100 MG PO CAPS: 100.0000 mg | ORAL_CAPSULE | Freq: Two times a day (BID) | ORAL | 0 refills | Status: DC

## 2019-07-22 MED ORDER — DOXYCYCLINE HYCLATE 100 MG PO TABS
100.0000 mg | ORAL_TABLET | Freq: Once | ORAL | Status: AC
  Administered 2019-07-22: 21:00:00 100 mg via ORAL
  Filled 2019-07-22: qty 1

## 2019-07-22 NOTE — ED Provider Notes (Signed)
[facility]-EMERGENCY DEPT Provider Note   CSN: [id number] Arrival date & time: 07/22/19  1616     History Chief Complaint  Patient presents with  . Abscess    Benjamin Irwin is a 44 y.o. male.  44 yo M with a cc of testicular pain and swelling.  This has been going on for about a week now.  States it has been draining a clearish fluid.  Was seen in the ED about 4 days ago was prescribed antibiotics but never got them filled.  Came to the ED again yesterday they are concerned about the size of his edema and the fact that he was tachycardic and had recommended an imaging study and lab work but unfortunately he was unable to stay at that time.  Overnight he feels that its gone down quite a bit.  Feels that it leaked out quite a bit of fluid.  Denies any fevers denies nausea or vomiting.  The history is provided by the patient.  Abscess Location:  Pelvis Pelvic abscess location:  Scrotum Size:  Date Abscess quality: weeping   Abscess quality: not painful and no redness   Red streaking: no   Duration:  1 week Progression:  Worsening Chronicity:  New Context: diabetes   Relieved by:  Nothing Worsened by:  Nothing Ineffective treatments:  None tried Associated symptoms: no fever, no headaches and no vomiting        Past Medical History:  Diagnosis Date  . Asthma   . Diabetes mellitus   . GERD (gastroesophageal reflux disease)   . IBS (irritable bowel syndrome)     There are no problems to display for this patient.   Past Surgical History:  Procedure Laterality Date  . CHOLECYSTECTOMY         No family history on file.  Social History   Tobacco Use  . Smoking status: Former Games developer  . Smokeless tobacco: Never Used  Substance Use Topics  . Alcohol use: No  . Drug use: Yes    Types: Marijuana    Home Medications Prior to Admission medications   Medication Sig Start Date End Date Taking? Authorizing Provider  docusate sodium  (COLACE) 100 MG capsule Take 1 capsule (100 mg total) by mouth every 12 (twelve) hours. 06/23/17   Khatri, Hina, PA-C  doxycycline (VIBRAMYCIN) 100 MG capsule Take 1 capsule (100 mg total) by mouth 2 (two) times daily. One po bid x 7 days 07/22/19   Melene Plan, DO    Allergies    Fish allergy and Shellfish allergy  Review of Systems   Review of Systems  Constitutional: Negative for chills and fever.  HENT: Negative for congestion and facial swelling.   Eyes: Negative for discharge and visual disturbance.  Respiratory: Negative for shortness of breath.   Cardiovascular: Negative for chest pain and palpitations.  Gastrointestinal: Negative for abdominal pain, diarrhea and vomiting.  Genitourinary: Positive for scrotal swelling.  Musculoskeletal: Negative for arthralgias and myalgias.  Skin: Negative for color change and rash.  Neurological: Negative for tremors, syncope and headaches.  Psychiatric/Behavioral: Negative for confusion and dysphoric mood.    Physical Exam Updated Vital Signs BP (!) 141/75   Pulse 97   Temp 99 F (37.2 C)   Resp 17   SpO2 100%   Physical Exam Vitals and nursing note reviewed.  Constitutional:      Appearance: He is well-developed.  HENT:     Head: Normocephalic and atraumatic.  Eyes:  Pupils: Pupils are equal, round, and reactive to light.  Neck:     Vascular: No JVD.  Cardiovascular:     Rate and Rhythm: Regular rhythm. Tachycardia present.     Heart sounds: No murmur. No friction rub. No gallop.   Pulmonary:     Effort: No respiratory distress.     Breath sounds: No wheezing.  Abdominal:     General: There is no distension.     Tenderness: There is no guarding or rebound.  Genitourinary:    Comments: Right scrotal mass without significant erythema induration fluctuance or tenderness.  This is palpated directly adjacent to the right testicle. Musculoskeletal:        General: Normal range of motion.     Cervical back: Normal range of  motion and neck supple.  Skin:    Coloration: Skin is not pale.     Findings: No rash.  Neurological:     Mental Status: He is alert and oriented to person, place, and time.  Psychiatric:        Behavior: Behavior normal.     ED Results / Procedures / Treatments   Labs (all labs ordered are listed, but only abnormal results are displayed) Labs Reviewed  CBC WITH DIFFERENTIAL/PLATELET - Abnormal; Notable for the following components:      Result Value   WBC 13.3 (*)    Neutro Abs 7.8 (*)    Lymphs Abs 4.2 (*)    All other components within normal limits  COMPREHENSIVE METABOLIC PANEL - Abnormal; Notable for the following components:   Chloride 97 (*)    Glucose, Bld 349 (*)    AST 11 (*)    All other components within normal limits  LACTIC ACID, PLASMA    EKG None  Radiology US SCROTUM W/DOPPLER  Result Date: 07/22/2019 CLINICAL DATA:  Draining right scrotal abscess EXAM: SCROTAL ULTRASOUND DOPPLER ULTRASOUND OF THE TESTICLES TECHNIQUE: Complete ultrasound examination of the testicles, epididymis, and other scrotal structures was performed. Color and spectral Doppler ultrasound were also utilized to evaluate blood flow to the testicles. COMPARISON:  None. FINDINGS: Right testicle Measurements: 2.7 x 2.1 x 2.3 cm. No mass or microlithiasis visualized. Left testicle Measurements: 3.5 x 2.1 x 2.3 cm. No mass or microlithiasis visualized. Right epididymis: Tiny anechoic 6 x 6 x 8 mm right epididymal head cyst. Left epididymis:  Normal in size and appearance. Hydrocele:  None visualized. Varicocele:  None visualized. Pulsed Doppler interrogation of both testes demonstrates normal low resistance arterial and venous waveforms bilaterally. Other: There is a complex area extending from the region of the right groin to the scrotal sac with marked overlying edematous changes and skin thickening as well as a thick walled collection with central hypoattenuating fluid with low level internal  echoes. Overlying skin thickening measures up to 9 mm in size. IMPRESSION: Appearance suggests a complex cystic fluid collection in the region of the right groin extending to the scrotal sac. Correlate with site of reported drainage. No evidence of testicular or epididymal extension. Small right epididymal head cyst is a typically benign finding. Electronically Signed   By: Lovena Le M.D.   On: 07/22/2019 19:55    Procedures Procedures (including critical care time)  Medications Ordered in ED Medications  doxycycline (VIBRA-TABS) tablet 100 mg (has no administration in time range)  sodium chloride 0.9 % bolus 1,000 mL (0 mLs Intravenous Stopped 07/22/19 2002)    ED Course  I have reviewed the triage vital signs and the  nursing notes.  Pertinent labs & imaging results that were available during my care of the patient were reviewed by me and considered in my medical decision making (see chart for details).    MDM Rules/Calculators/A&P                      44 yo M with a chief complaints of right-sided scrotal swelling.  This is his third visit down to the ED.  My exam is not consistent with a abscess.  Looking at the prior notes there was some concern that it was nonpurulent drainage.  It appears to be affixed to the right testicle make me more concerned for testicular mass.  Will obtain a scrotal ultrasound.  Lab work as the patient is significantly tachycardic.  Give a bolus of IV fluids and reassess.  Tachycardia is resolved.  Discussed case with Dr. Liliane Shi, urology. he recommended starting the patient on antibiotics and having him follow-up in the office.  8:52 PM:  I have discussed the diagnosis/risks/treatment options with the patient and believe the pt to be eligible for discharge home to follow-up with PCP. We also discussed returning to the ED immediately if new or worsening sx occur. We discussed the sx which are most concerning (e.g., sudden worsening pain, fever, inability to  tolerate by mouth) that necessitate immediate return. Medications administered to the patient during their visit and any new prescriptions provided to the patient are listed below.  Medications given during this visit Medications  doxycycline (VIBRA-TABS) tablet 100 mg (has no administration in time range)  sodium chloride 0.9 % bolus 1,000 mL (0 mLs Intravenous Stopped 07/22/19 2002)     The patient appears reasonably screen and/or stabilized for discharge and I doubt any other medical condition or other [facility] requiring further screening, evaluation, or treatment in the ED at this time prior to discharge.   Final Clinical Impression(s) / ED Diagnoses Final diagnoses:  Scrotal mass    Rx / DC Orders ED Discharge Orders         Ordered    doxycycline (VIBRAMYCIN) 100 MG capsule  2 times daily     07/22/19 2042           Melene Plan, DO 07/22/19 2052

## 2019-07-22 NOTE — ED Triage Notes (Addendum)
Patient reports abscess to right groin. Hx of same. Seen yesterday for same. States actively draining. Denies taking abx at this time.

## 2019-07-22 NOTE — Discharge Instructions (Signed)
Warm compresses at least 4x a day.  Follow up with urology.  Take the antibiotics as prescribed

## 2019-12-13 ENCOUNTER — Encounter (HOSPITAL_BASED_OUTPATIENT_CLINIC_OR_DEPARTMENT_OTHER): Payer: Self-pay | Admitting: *Deleted

## 2019-12-13 ENCOUNTER — Other Ambulatory Visit: Payer: Self-pay

## 2019-12-13 ENCOUNTER — Emergency Department (HOSPITAL_BASED_OUTPATIENT_CLINIC_OR_DEPARTMENT_OTHER)
Admission: EM | Admit: 2019-12-13 | Discharge: 2019-12-13 | Disposition: A | Discharge status: Home / Self Care | Payer: Medicaid Other | Attending: Emergency Medicine | Admitting: Emergency Medicine

## 2019-12-13 DIAGNOSIS — E119 Type 2 diabetes mellitus without complications: Secondary | ICD-10-CM | POA: Insufficient documentation

## 2019-12-13 DIAGNOSIS — J45909 Unspecified asthma, uncomplicated: Secondary | ICD-10-CM | POA: Insufficient documentation

## 2019-12-13 DIAGNOSIS — L03011 Cellulitis of right finger: Secondary | ICD-10-CM | POA: Insufficient documentation

## 2019-12-13 DIAGNOSIS — Z87891 Personal history of nicotine dependence: Secondary | ICD-10-CM | POA: Insufficient documentation

## 2019-12-13 MED ORDER — CEPHALEXIN 250 MG PO CAPS
500.0000 mg | ORAL_CAPSULE | Freq: Once | ORAL | Status: AC
  Administered 2019-12-13: 500 mg via ORAL
  Filled 2019-12-13: qty 2

## 2019-12-13 MED ORDER — CEPHALEXIN 500 MG PO CAPS: 500.0000 mg | ORAL_CAPSULE | Freq: Four times a day (QID) | ORAL | 0 refills | Status: AC

## 2019-12-13 NOTE — ED Triage Notes (Signed)
Pt c/o right middle finger cuticle infection x 2 days

## 2019-12-13 NOTE — ED Provider Notes (Signed)
[facility] EMERGENCY DEPARTMENT Provider Note   CSN: [id number] Arrival date & time: 12/13/19  1909     History Chief Complaint  Patient presents with  . Hand Pain    Benjamin Irwin is a 44 y.o. male.  HPI Patient is a 44 year old male who presents with 2 days of pain and redness along the distal end of the right middle finger around the fingernail.  Patient denies a history of similar symptoms.  He states that prior to this occurring he had a "hangnail" that he pulled off.  He has no other complaints at this time.  No fevers, chills, nausea, vomiting.    Past Medical History:  Diagnosis Date  . Asthma   . Diabetes mellitus   . GERD (gastroesophageal reflux disease)   . IBS (irritable bowel syndrome)     There are no problems to display for this patient.   Past Surgical History:  Procedure Laterality Date  . CHOLECYSTECTOMY         No family history on file.  Social History   Tobacco Use  . Smoking status: Former Games developer  . Smokeless tobacco: Never Used  Vaping Use  . Vaping Use: Never used  Substance Use Topics  . Alcohol use: No  . Drug use: Yes    Types: Marijuana    Home Medications Prior to Admission medications   Medication Sig Start Date End Date Taking? Authorizing Provider  cephALEXin (KEFLEX) 500 MG capsule Take 1 capsule (500 mg total) by mouth 4 (four) times daily for 3 days. 12/13/19 12/16/19  Placido Sou, PA-C  docusate sodium (COLACE) 100 MG capsule Take 1 capsule (100 mg total) by mouth every 12 (twelve) hours. 06/23/17   Khatri, Hina, PA-C  doxycycline (VIBRAMYCIN) 100 MG capsule Take 1 capsule (100 mg total) by mouth 2 (two) times daily. One po bid x 7 days 07/22/19   Melene Plan, DO    Allergies    Fish allergy and Shellfish allergy  Review of Systems   Review of Systems  Constitutional: Negative for chills and fever.  Gastrointestinal: Negative for nausea and vomiting.  Musculoskeletal: Positive for arthralgias.    Skin: Positive for color change and wound.   Physical Exam Updated Vital Signs BP 138/74 (BP Location: Left Arm)   Pulse 91   Temp 98.4 F (36.9 C) (Oral)   Resp 18   Ht 6' (1.829 m)   Wt 125.2 kg   SpO2 99%   BMI 37.43 kg/m   Physical Exam Vitals and nursing note reviewed.  Constitutional:      General: He is not in acute distress.    Appearance: Normal appearance. He is well-developed and normal weight. He is not ill-appearing.  HENT:     Head: Normocephalic and atraumatic.     Right Ear: External ear normal.     Left Ear: External ear normal.     Mouth/Throat:     Pharynx: Oropharynx is clear.  Eyes:     General: No scleral icterus.       Right eye: No discharge.        Left eye: No discharge.     Conjunctiva/sclera: Conjunctivae normal.  Neck:     Trachea: No tracheal deviation.  Cardiovascular:     Rate and Rhythm: Normal rate.  Pulmonary:     Effort: Pulmonary effort is normal. No respiratory distress.     Breath sounds: No stridor.  Abdominal:     General: There is no distension.  Musculoskeletal:        General: No swelling or deformity.     Cervical back: Neck supple.  Skin:    General: Skin is warm and dry.     Findings: No rash.     Comments: Mild erythema with trace edema noted just proximal to the nailbed of the right middle finger.  Mild TTP overlying the site.  Full range of motion of all the fingers of the right hand.  2+ radial pulses noted bilaterally.  Distal sensation intact.  Neurological:     Mental Status: He is alert.     Cranial Nerves: Cranial nerve deficit: no gross deficits.    ED Results / Procedures / Treatments   Labs (all labs ordered are listed, but only abnormal results are displayed) Labs Reviewed - No data to display  EKG None  Radiology No results found.  Procedures Procedures (including critical care time)  Medications Ordered in ED Medications  cephALEXin (KEFLEX) capsule 500 mg (500 mg Oral Given 12/13/19  2226)    ED Course  I have reviewed the triage vital signs and the nursing notes.  Pertinent labs & imaging results that were available during my care of the patient were reviewed by me and considered in my medical decision making (see chart for details).    MDM Rules/Calculators/A&P                          Pt is a 44 y/o male that presents with signs and symptoms of a paronychia of the right middle finger.  Mild erythema and TTP on PE. No palpable fluctuance or any significant edema. Full ROM of the finger and finger is neurovascularly intact. I recommended not moving forward with an incision and patient was amenable. He was given a dose of keflex in the ED due to it being late in the evening and was discharged with a short course of keflex. Recommended warm soaks and applying hot compresses to the site. Tylenol and ibuprofen for pain management.  His questions were answered and he was amicable at the time of d/c. VSS.  Patient discharged to home/self care.  Condition at discharge: Stable  Note: Portions of this report may have been transcribed using voice recognition software. Every effort was made to ensure accuracy; however, inadvertent computerized transcription errors may be present.   Final Clinical Impression(s) / ED Diagnoses Final diagnoses:  Paronychia of finger, right    Rx / DC Orders ED Discharge Orders         Ordered    cephALEXin (KEFLEX) 500 MG capsule  4 times daily     Discontinue  Reprint     12/13/19 2215           Rayna Sexton, PA-C 12/14/19 0906    Drenda Freeze, MD 12/14/19 [phone]

## 2019-12-13 NOTE — Discharge Instructions (Addendum)
Please continue to apply warm compresses to the affected finger.  Please take ibuprofen as needed for pain management.  I am prescribing you an antibiotic called Keflex.  Please take this 4 times a day for the next 3 days for the infection in your finger.  If your symptoms worsen I need you to return to the emergency department for immediate reevaluation.  It was a pleasure to meet you.

## 2019-12-21 ENCOUNTER — Encounter (HOSPITAL_BASED_OUTPATIENT_CLINIC_OR_DEPARTMENT_OTHER): Payer: Self-pay | Admitting: Emergency Medicine

## 2019-12-21 ENCOUNTER — Other Ambulatory Visit: Payer: Self-pay

## 2019-12-21 ENCOUNTER — Emergency Department (HOSPITAL_BASED_OUTPATIENT_CLINIC_OR_DEPARTMENT_OTHER)
Admission: EM | Admit: 2019-12-21 | Discharge: 2019-12-21 | Disposition: A | Discharge status: Home / Self Care | Payer: Medicaid Other | Attending: Emergency Medicine | Admitting: Emergency Medicine

## 2019-12-21 DIAGNOSIS — J45909 Unspecified asthma, uncomplicated: Secondary | ICD-10-CM | POA: Insufficient documentation

## 2019-12-21 DIAGNOSIS — I10 Essential (primary) hypertension: Secondary | ICD-10-CM | POA: Insufficient documentation

## 2019-12-21 DIAGNOSIS — L03011 Cellulitis of right finger: Secondary | ICD-10-CM | POA: Insufficient documentation

## 2019-12-21 DIAGNOSIS — E119 Type 2 diabetes mellitus without complications: Secondary | ICD-10-CM | POA: Insufficient documentation

## 2019-12-21 DIAGNOSIS — Z87891 Personal history of nicotine dependence: Secondary | ICD-10-CM | POA: Insufficient documentation

## 2019-12-21 MED ORDER — DOXYCYCLINE HYCLATE 100 MG PO CAPS: 100.0000 mg | ORAL_CAPSULE | Freq: Two times a day (BID) | ORAL | 0 refills | Status: DC

## 2019-12-21 MED FILL — DOXYCYCLINE HYCLATE 100 MG: 100 | 7 days supply | Qty: 14 | Fill #0

## 2019-12-21 NOTE — ED Triage Notes (Signed)
Paronychia to R middle finger. Finished abx from last visit without improvement

## 2019-12-21 NOTE — Discharge Instructions (Signed)
Continue to soak the finger.  Switch over to the antibiotic doxycycline.  Follow-up with your doctors.  Return for any new or worse symptoms.  Return specifically if there is any swelling to the rest of the middle finger.

## 2019-12-21 NOTE — ED Provider Notes (Signed)
[facility] EMERGENCY DEPARTMENT Provider Note   CSN: [id number] Arrival date & time: 12/21/19  1950     History Chief Complaint  Patient presents with  . paronychia    Benjamin Irwin is a 44 y.o. male.  Patient with paronychia to the right middle finger seen in the emergency department on June 23.  Started on Keflex.  Is been soaking finger.  Is never been any drainage of pus.  Still has some swelling and some tenderness to the distal middle finger.  No proximal swelling.  No trouble with range of motion.        Past Medical History:  Diagnosis Date  . Asthma   . Diabetes mellitus   . GERD (gastroesophageal reflux disease)   . IBS (irritable bowel syndrome)     There are no problems to display for this patient.   Past Surgical History:  Procedure Laterality Date  . CHOLECYSTECTOMY         No family history on file.  Social History   Tobacco Use  . Smoking status: Former Games developer  . Smokeless tobacco: Never Used  Vaping Use  . Vaping Use: Never used  Substance Use Topics  . Alcohol use: No  . Drug use: Yes    Types: Marijuana    Home Medications Prior to Admission medications   Medication Sig Start Date End Date Taking? Authorizing Provider  docusate sodium (COLACE) 100 MG capsule Take 1 capsule (100 mg total) by mouth every 12 (twelve) hours. 06/23/17   Khatri, Hina, PA-C  doxycycline (VIBRAMYCIN) 100 MG capsule Take 1 capsule (100 mg total) by mouth 2 (two) times daily. One po bid x 7 days 07/22/19   Melene Plan, DO  doxycycline (VIBRAMYCIN) 100 MG capsule Take 1 capsule (100 mg total) by mouth 2 (two) times daily. 12/21/19   Vanetta Mulders, MD    Allergies    Fish allergy and Shellfish allergy  Review of Systems   Review of Systems  Constitutional: Negative for chills and fever.  HENT: Negative for congestion, rhinorrhea and sore throat.   Eyes: Negative for visual disturbance.  Respiratory: Negative for cough and shortness of breath.    Cardiovascular: Negative for chest pain and leg swelling.  Gastrointestinal: Negative for abdominal pain, diarrhea, nausea and vomiting.  Genitourinary: Negative for dysuria.  Musculoskeletal: Negative for back pain and neck pain.  Skin: Negative for rash.  Neurological: Negative for dizziness, light-headedness and headaches.  Hematological: Does not bruise/bleed easily.  Psychiatric/Behavioral: Negative for confusion.    Physical Exam Updated Vital Signs BP (!) 147/83 (BP Location: Right Arm)   Pulse 100   Temp 98.3 F (36.8 C) (Oral)   Resp 18   Ht 1.829 m (6')   Wt 126.1 kg   SpO2 100%   BMI 37.70 kg/m   Physical Exam Vitals and nursing note reviewed.  Constitutional:      Appearance: He is well-developed.  HENT:     Head: Normocephalic and atraumatic.  Eyes:     Conjunctiva/sclera: Conjunctivae normal.  Cardiovascular:     Rate and Rhythm: Normal rate and regular rhythm.     Heart sounds: No murmur heard.   Pulmonary:     Effort: Pulmonary effort is normal. No respiratory distress.     Breath sounds: Normal breath sounds.  Abdominal:     Palpations: Abdomen is soft.     Tenderness: There is no abdominal tenderness.  Musculoskeletal:        General: Swelling and  tenderness present.     Cervical back: Neck supple.     Comments: Right hand distal middle finger with healing paronychia.  Mostly induration no expression of pus.  Some tenderness to the pulp and around the lateral aspect of the nailbed.  No proximal swelling.  Excellent range of motion sensation intact.  Radial pulse 2+.  Skin:    General: Skin is warm and dry.     Capillary Refill: Capillary refill takes less than 2 seconds.  Neurological:     General: No focal deficit present.     Mental Status: He is alert and oriented to person, place, and time.     ED Results / Procedures / Treatments   Labs (all labs ordered are listed, but only abnormal results are displayed) Labs Reviewed - No data to  display  EKG None  Radiology No results found.  Procedures Procedures (including critical care time)  Medications Ordered in ED Medications - No data to display  ED Course  I have reviewed the triage vital signs and the nursing notes.  Pertinent labs & imaging results that were available during my care of the patient were reviewed by me and considered in my medical decision making (see chart for details).    MDM Rules/Calculators/A&P                          Patient with a bit of a chronic paronychia of the right middle finger.  No progression of infections never drained.  Mostly induration at this point time we will continue to have him soak the finger and will switch him over to the antibiotic doxycycline.  This is been ongoing since June 23 not getting worse slowly improving.  Patient prefers not to have it open.  Specifically do not feel that there is a an acute pus pocket.  Feel that this is more chronically indurated.   Final Clinical Impression(s) / ED Diagnoses Final diagnoses:  Paronychia of right middle finger    Rx / DC Orders ED Discharge Orders         Ordered    doxycycline (VIBRAMYCIN) 100 MG capsule  2 times daily     Discontinue  Reprint     12/21/19 1029           Vanetta Mulders, MD 12/21/19 1034

## 2020-07-18 ENCOUNTER — Emergency Department (HOSPITAL_BASED_OUTPATIENT_CLINIC_OR_DEPARTMENT_OTHER)
Admission: EM | Admit: 2020-07-18 | Discharge: 2020-07-18 | Disposition: A | Discharge status: Home / Self Care | Payer: Medicaid Other | Attending: Emergency Medicine | Admitting: Emergency Medicine

## 2020-07-18 ENCOUNTER — Encounter (HOSPITAL_BASED_OUTPATIENT_CLINIC_OR_DEPARTMENT_OTHER): Payer: Self-pay | Admitting: *Deleted

## 2020-07-18 ENCOUNTER — Other Ambulatory Visit: Payer: Self-pay

## 2020-07-18 DIAGNOSIS — J45909 Unspecified asthma, uncomplicated: Secondary | ICD-10-CM | POA: Insufficient documentation

## 2020-07-18 DIAGNOSIS — E119 Type 2 diabetes mellitus without complications: Secondary | ICD-10-CM | POA: Insufficient documentation

## 2020-07-18 DIAGNOSIS — Z87891 Personal history of nicotine dependence: Secondary | ICD-10-CM | POA: Insufficient documentation

## 2020-07-18 DIAGNOSIS — J0101 Acute recurrent maxillary sinusitis: Secondary | ICD-10-CM

## 2020-07-18 MED ORDER — DOXYCYCLINE HYCLATE 100 MG PO TABS: 100.0000 mg | ORAL_TABLET | Freq: Two times a day (BID) | ORAL | 0 refills | Status: DC

## 2020-07-18 NOTE — ED Provider Notes (Signed)
[facility] EMERGENCY DEPARTMENT Provider Note   CSN: [id number] Arrival date & time: 07/18/20  1143     History Chief Complaint  Patient presents with  . Sinus infection/congestion    Benjamin Irwin is a 45 y.o. male.  The history is provided by the patient. No language interpreter was used.  Cough Cough characteristics:  Non-productive Sputum characteristics:  Nondescript Severity:  Moderate Onset quality:  Gradual Timing:  Constant Progression:  Worsening Chronicity:  Recurrent Smoker: no   Context: upper respiratory infection   Relieved by:  Nothing Worsened by:  Nothing Ineffective treatments:  None tried Associated symptoms: sinus congestion   Pt complains of feeling like he has a sinus infection.  Pt reports pain and pressure left side of face.  Pt refuses covid test.  He says he has had same in the past.  Pt reports he has had symptoms for over a week      Past Medical History:  Diagnosis Date  . Asthma   . Diabetes mellitus   . GERD (gastroesophageal reflux disease)   . IBS (irritable bowel syndrome)     There are no problems to display for this patient.   Past Surgical History:  Procedure Laterality Date  . CHOLECYSTECTOMY         History reviewed. No pertinent family history.  Social History   Tobacco Use  . Smoking status: Former Games developer  . Smokeless tobacco: Never Used  Vaping Use  . Vaping Use: Never used  Substance Use Topics  . Alcohol use: No  . Drug use: Not Currently    Types: Marijuana    Home Medications Prior to Admission medications   Medication Sig Start Date End Date Taking? Authorizing Provider  docusate sodium (COLACE) 100 MG capsule Take 1 capsule (100 mg total) by mouth every 12 (twelve) hours. 06/23/17   Khatri, Hina, PA-C  doxycycline (VIBRAMYCIN) 100 MG capsule Take 1 capsule (100 mg total) by mouth 2 (two) times daily. One po bid x 7 days 07/22/19   Melene Plan, DO  doxycycline (VIBRAMYCIN) 100 MG  capsule Take 1 capsule (100 mg total) by mouth 2 (two) times daily. 12/21/19   Vanetta Mulders, MD    Allergies    Fish allergy and Shellfish allergy  Review of Systems   Review of Systems  Respiratory: Positive for cough.   All other systems reviewed and are negative.   Physical Exam Updated Vital Signs BP (!) 143/94 (BP Location: Right Arm)   Pulse (!) 115   Temp 98.1 F (36.7 C) (Oral)   Resp 16   Ht 6' (1.829 m)   Wt 124.7 kg   SpO2 100%   BMI 37.30 kg/m   Physical Exam Vitals and nursing note reviewed.  Constitutional:      Appearance: He is well-developed and well-nourished.  HENT:     Head: Normocephalic.     Mouth/Throat:     Mouth: Mucous membranes are moist.  Eyes:     Extraocular Movements: EOM normal.     Pupils: Pupils are equal, round, and reactive to light.  Pulmonary:     Effort: Pulmonary effort is normal.     Breath sounds: Rhonchi present.  Abdominal:     General: There is no distension.  Musculoskeletal:        General: Normal range of motion.     Cervical back: Normal range of motion.  Skin:    General: Skin is warm.  Neurological:     General:  No focal deficit present.     Mental Status: He is alert and oriented to person, place, and time.  Psychiatric:        Mood and Affect: Mood and affect and mood normal.     ED Results / Procedures / Treatments   Labs (all labs ordered are listed, but only abnormal results are displayed) Labs Reviewed - No data to display  EKG None  Radiology No results found.  Procedures Procedures   Medications Ordered in ED Medications - No data to display  ED Course  I have reviewed the triage vital signs and the nursing notes.  Pertinent labs & imaging results that were available during my care of the patient were reviewed by me and considered in my medical decision making (see chart for details).    MDM Rules/Calculators/A&P                          MDM: Pt may have sinus infection.  He  declined covid test,He has some rhonchi on lung exam, Pt states he has asthma.  I will treat with doxycycline Final Clinical Impression(s) / ED Diagnoses Final diagnoses:  Acute recurrent maxillary sinusitis    Rx / DC Orders ED Discharge Orders         Ordered    doxycycline (VIBRA-TABS) 100 MG tablet  2 times daily        07/18/20 1424        An After Visit Summary was printed and given to the patient.    Elson Areas, [place] 07/18/20 1424    Alvira Monday, MD 07/21/20 1106

## 2020-07-18 NOTE — ED Triage Notes (Signed)
Sinus infection/congestion for a week.  Has been taking Benadryl with minimal effect.  Denies fever.

## 2020-08-18 ENCOUNTER — Other Ambulatory Visit: Payer: Self-pay

## 2020-08-18 ENCOUNTER — Emergency Department (HOSPITAL_BASED_OUTPATIENT_CLINIC_OR_DEPARTMENT_OTHER)
Admission: EM | Admit: 2020-08-18 | Discharge: 2020-08-18 | Disposition: A | Discharge status: Home / Self Care | Payer: Self-pay | Attending: Emergency Medicine | Admitting: Emergency Medicine

## 2020-08-18 ENCOUNTER — Emergency Department (HOSPITAL_BASED_OUTPATIENT_CLINIC_OR_DEPARTMENT_OTHER): Payer: Self-pay

## 2020-08-18 ENCOUNTER — Encounter (HOSPITAL_BASED_OUTPATIENT_CLINIC_OR_DEPARTMENT_OTHER): Payer: Self-pay

## 2020-08-18 DIAGNOSIS — S8992XA Unspecified injury of left lower leg, initial encounter: Secondary | ICD-10-CM | POA: Insufficient documentation

## 2020-08-18 DIAGNOSIS — J45909 Unspecified asthma, uncomplicated: Secondary | ICD-10-CM | POA: Insufficient documentation

## 2020-08-18 DIAGNOSIS — W010XXA Fall on same level from slipping, tripping and stumbling without subsequent striking against object, initial encounter: Secondary | ICD-10-CM | POA: Insufficient documentation

## 2020-08-18 DIAGNOSIS — Y92009 Unspecified place in unspecified non-institutional (private) residence as the place of occurrence of the external cause: Secondary | ICD-10-CM | POA: Insufficient documentation

## 2020-08-18 DIAGNOSIS — Z87891 Personal history of nicotine dependence: Secondary | ICD-10-CM | POA: Insufficient documentation

## 2020-08-18 DIAGNOSIS — W19XXXA Unspecified fall, initial encounter: Secondary | ICD-10-CM

## 2020-08-18 DIAGNOSIS — E119 Type 2 diabetes mellitus without complications: Secondary | ICD-10-CM | POA: Insufficient documentation

## 2020-08-18 DIAGNOSIS — S92412A Displaced fracture of proximal phalanx of left great toe, initial encounter for closed fracture: Secondary | ICD-10-CM | POA: Insufficient documentation

## 2020-08-18 NOTE — ED Provider Notes (Signed)
[facility] EMERGENCY DEPARTMENT Provider Note   CSN: [id number] Arrival date & time: 08/18/20  1429     History Chief Complaint  Patient presents with  . Fall    Benjamin Irwin is a 45 y.o. male presenting to the emergency department with complaint of left foot and knee pain after mechanical fall that occurred early this morning.  He states he tripped on his rugs and when he fell he kicked the wall causing pain to his left great toe.  He states when he fell his leg was bent under him and he hurt his knee as well.  He is having pain in the medial aspect of the knee it is worse with weightbearing though he is able to range his knee.  He treated his symptoms with Tylenol.  Did not hit his head or have any other injuries.  Not on anticoagulation.  The history is provided by the patient.       Past Medical History:  Diagnosis Date  . Asthma   . Diabetes mellitus   . GERD (gastroesophageal reflux disease)   . IBS (irritable bowel syndrome)     There are no problems to display for this patient.   Past Surgical History:  Procedure Laterality Date  . CHOLECYSTECTOMY         No family history on file.  Social History   Tobacco Use  . Smoking status: Former Games developer  . Smokeless tobacco: Never Used  Vaping Use  . Vaping Use: Never used  Substance Use Topics  . Alcohol use: No  . Drug use: Not Currently    Types: Marijuana    Home Medications Prior to Admission medications   Medication Sig Start Date End Date Taking? Authorizing Provider  docusate sodium (COLACE) 100 MG capsule Take 1 capsule (100 mg total) by mouth every 12 (twelve) hours. 06/23/17   Khatri, Hina, PA-C  doxycycline (VIBRA-TABS) 100 MG tablet Take 1 tablet (100 mg total) by mouth 2 (two) times daily. 07/18/20   Elson Areas, PA-C    Allergies    Fish allergy and Shellfish allergy  Review of Systems   Review of Systems  Musculoskeletal: Positive for arthralgias.  Skin: Negative for  wound.    Physical Exam Updated Vital Signs BP (!) 146/79 (BP Location: Right Arm)   Pulse 99   Temp 98.3 F (36.8 C) (Oral)   Resp 16   Ht 6' (1.829 m)   Wt 124.7 kg   SpO2 100%   BMI 37.30 kg/m   Physical Exam Vitals and nursing note reviewed.  Constitutional:      General: He is not in acute distress.    Appearance: He is well-developed.  HENT:     Head: Normocephalic and atraumatic.  Eyes:     Conjunctiva/sclera: Conjunctivae normal.  Cardiovascular:     Rate and Rhythm: Normal rate.  Pulmonary:     Effort: Pulmonary effort is normal.  Musculoskeletal:     Comments: Left great toe with some bruising along the center nail fold.  No subungual hematoma.  There is some generalized mild swelling and tenderness to the toe. Mild tenderness to the dorsum of the foot with swelling noted.  Normal range of motion the ankle, ankles nontender. Left knee with mild swelling noted. TTP to medial aspect. Normal ROM. Neg anterior posterior drawer as well as normal valgus/varus tests. Intact DP pulse and sensation. No wounds to left lower extremity  Neurological:     Mental Status: He  is alert.  Psychiatric:        Mood and Affect: Mood normal.        Behavior: Behavior normal.     ED Results / Procedures / Treatments   Labs (all labs ordered are listed, but only abnormal results are displayed) Labs Reviewed - No data to display  EKG None  Radiology DG Knee Complete 4 Views Left  Result Date: 08/18/2020 CLINICAL DATA:  Fall with left knee pain. EXAM: LEFT KNEE - COMPLETE 4+ VIEW COMPARISON:  None. FINDINGS: Minimal tricompartmental osteoarthritic change. No evidence of acute fracture or dislocation. No significant joint effusion. IMPRESSION: 1. No acute findings. 2. Minimal osteoarthritic change. Electronically Signed   By: Elberta Fortis M.D.   On: 08/18/2020 15:32   DG Foot Complete Left  Result Date: 08/18/2020 CLINICAL DATA:  Fall with left foot pain. EXAM: LEFT FOOT -  COMPLETE 3+ VIEW COMPARISON:  None. FINDINGS: Mild degenerative change of the first MTP joint. Mild degenerate change of the interphalangeal joints. Minimally displaced acute fracture of the base of the first proximal phalanx with extension to the articular surface. Small inferior calcaneal spur. IMPRESSION: Minimally displaced acute fracture of the base of the first proximal phalanx. Electronically Signed   By: Elberta Fortis M.D.   On: 08/18/2020 15:33    Procedures Procedures   Medications Ordered in ED Medications - No data to display  ED Course  I have reviewed the triage vital signs and the nursing notes.  Pertinent labs & imaging results that were available during my care of the patient were reviewed by me and considered in my medical decision making (see chart for details).    MDM Rules/Calculators/A&P                          Patient presenting with medial left knee pain and left great toe pain after mechanical fall this morning.  X-ray with minimally displaced fracture through the base of the first proximal phalanx -closed.  X-ray of the knee is negative.  Although there is some concern for sprain given mechanism and location of pain.  Will place in hinged knee brace and postop shoe, crutches for nonweightbearing.  Sports medicine referral provided for follow-up.  Discussed results, findings, treatment and follow up. Patient advised of return precautions. Patient verbalized understanding and agreed with plan.  Final Clinical Impression(s) / ED Diagnoses Final diagnoses:  Displaced fracture of proximal phalanx of left great toe, initial encounter for closed fracture  Left knee injury, initial encounter  Fall in home, initial encounter    Rx / DC Orders ED Discharge Orders    None       Robinson, [place] N, PA-C 08/18/20 1550    Jacalyn Lefevre, MD 08/18/20 [id number]

## 2020-08-18 NOTE — Discharge Instructions (Signed)
Please read instructions below. Apply ice to your knee and toe for 20 minutes at a time. Elevate to help with swelling. You can take ibuprofen or tylenol every 6 hours as needed for pain. Schedule an appointment with the sports medicine specialist follow-up on your injuries. Return to the ER for new or concerning symptoms.

## 2020-08-18 NOTE — ED Triage Notes (Signed)
Pt arrives with c/o pain to left knee and left toes with bruising to left toes after slipping in his house around 6 am today. Pt reports his foot hit a wall when he fell. Pt states he fell because it was slick.

## 2020-08-26 ENCOUNTER — Ambulatory Visit: Payer: Self-pay

## 2020-08-26 ENCOUNTER — Ambulatory Visit (INDEPENDENT_AMBULATORY_CARE_PROVIDER_SITE_OTHER): Payer: Medicaid Other | Admitting: Family Medicine

## 2020-08-26 ENCOUNTER — Encounter: Payer: Self-pay | Admitting: Family Medicine

## 2020-08-26 ENCOUNTER — Other Ambulatory Visit: Payer: Self-pay

## 2020-08-26 VITALS — BP 128/80 | Ht 72.0 in | Wt 275.0 lb

## 2020-08-26 DIAGNOSIS — M25562 Pain in left knee: Secondary | ICD-10-CM

## 2020-08-26 DIAGNOSIS — S92415A Nondisplaced fracture of proximal phalanx of left great toe, initial encounter for closed fracture: Secondary | ICD-10-CM

## 2020-08-26 DIAGNOSIS — S86812A Strain of other muscle(s) and tendon(s) at lower leg level, left leg, initial encounter: Secondary | ICD-10-CM

## 2020-08-26 NOTE — Assessment & Plan Note (Signed)
Injury occurred on 2/27.  Has little to no pain. -Counseled on home exercise therapy and supportive care. -Continue postop shoe. -Provided work note. -Follow-up in 2 weeks.

## 2020-08-26 NOTE — Progress Notes (Signed)
°  Benjamin Irwin - 45 y.o. male MRN [id number]  Date of birth: 06-04-1976  SUBJECTIVE:  Including CC & ROS.  No chief complaint on file.   Benjamin Irwin is a 45 y.o. male that is presenting with left knee pain and left toe pain.  He had an injury while at his house.  His leg was bent backwards and then rapidly into extension.  He hit his toe on the base of the wall.  Having worsening knee pain.  The pain is on the medial aspect.  No history of similar pain.  Toe does not hurt that bad.   Review of Systems See HPI   HISTORY: Past Medical, Surgical, Social, and Family History Reviewed & Updated per EMR.   Pertinent Historical Findings include:  Past Medical History:  Diagnosis Date   Asthma    Diabetes mellitus    GERD (gastroesophageal reflux disease)    IBS (irritable bowel syndrome)     Past Surgical History:  Procedure Laterality Date   CHOLECYSTECTOMY      No family history on file.  Social History   Socioeconomic History   Marital status: Single    Spouse name: Not on file   Number of children: Not on file   Years of education: Not on file   Highest education level: Not on file  Occupational History   Not on file  Tobacco Use   Smoking status: Former Smoker   Smokeless tobacco: Never Used  Building services engineer Use: Never used  Substance and Sexual Activity   Alcohol use: No   Drug use: Not Currently    Types: Marijuana   Sexual activity: Not on file  Other Topics Concern   Not on file  Social History Narrative   Not on file   Social Determinants of Health   Financial Resource Strain: Not on file  Food Insecurity: Not on file  Transportation Needs: Not on file  Physical Activity: Not on file  Stress: Not on file  Social Connections: Not on file  Intimate Partner Violence: Not on file     PHYSICAL EXAM:  VS: BP 128/80 (BP Location: Left Arm, Patient Position: Sitting, Cuff Size: Large)    Ht 6' (1.829 m)    Wt 275 lb  (124.7 kg)    BMI 37.30 kg/m  Physical Exam Gen: NAD, alert, cooperative with exam, well-appearing MSK:  Left knee: Mild effusion. Normal range of motion. Tenderness to palpation of the medial compartment. Neurovascular intact  Limited ultrasound: Left knee:  Mild effusion. Normal-appearing quadricep and patellar tendon. No changes of the medial meniscus. Origin of the MCL appears to be structurally normal. The medial retinaculum has hypoechoic changes and hyperemia to suggest recent injury.  Summary: Findings suggest partial tear of the medial retinaculum  Ultrasound and interpretation by Clare Gandy, MD    ASSESSMENT & PLAN:   Traumatic medial retinacular tear of left knee Injury occurred on 2/27.  Appears that the retinaculum appears to be involved.  Has good range of motion.  Has good strength. -Counseled on home exercise therapy and supportive care. -Hinged knee brace. -Could consider physical therapy.  Nondisplaced fracture of proximal phalanx of left great toe, initial encounter for closed fracture Injury occurred on 2/27.  Has little to no pain. -Counseled on home exercise therapy and supportive care. -Continue postop shoe. -Provided work note. -Follow-up in 2 weeks.

## 2020-08-26 NOTE — Assessment & Plan Note (Signed)
Injury occurred on 2/27.  Appears that the retinaculum appears to be involved.  Has good range of motion.  Has good strength. -Counseled on home exercise therapy and supportive care. -Hinged knee brace. -Could consider physical therapy.

## 2020-08-26 NOTE — Patient Instructions (Signed)
Nice to meet you Please try ice as needed Please try the range of motion for the knee Please continue the post op shoe  Please send me a message in MyChart with any questions or updates.  Please see me back in 2 weeks.   --Dr. Jordan Likes

## 2020-09-09 ENCOUNTER — Ambulatory Visit: Payer: Medicaid Other | Admitting: Family Medicine

## 2020-09-09 NOTE — Progress Notes (Deleted)
  Benjamin Irwin - 45 y.o. male MRN [id number]  Date of birth: Aug 04, 1975  SUBJECTIVE:  Including CC & ROS.  No chief complaint on file.   Benjamin Irwin is a 45 y.o. male that is  ***.  ***   Review of Systems See HPI   HISTORY: Past Medical, Surgical, Social, and Family History Reviewed & Updated per EMR.   Pertinent Historical Findings include:  Past Medical History:  Diagnosis Date  . Asthma   . Diabetes mellitus   . GERD (gastroesophageal reflux disease)   . IBS (irritable bowel syndrome)     Past Surgical History:  Procedure Laterality Date  . CHOLECYSTECTOMY      No family history on file.  Social History   Socioeconomic History  . Marital status: Single    Spouse name: Not on file  . Number of children: Not on file  . Years of education: Not on file  . Highest education level: Not on file  Occupational History  . Not on file  Tobacco Use  . Smoking status: Former Games developer  . Smokeless tobacco: Never Used  Vaping Use  . Vaping Use: Never used  Substance and Sexual Activity  . Alcohol use: No  . Drug use: Not Currently    Types: Marijuana  . Sexual activity: Not on file  Other Topics Concern  . Not on file  Social History Narrative  . Not on file   Social Determinants of Health   Financial Resource Strain: Not on file  Food Insecurity: Not on file  Transportation Needs: Not on file  Physical Activity: Not on file  Stress: Not on file  Social Connections: Not on file  Intimate Partner Violence: Not on file     PHYSICAL EXAM:  VS: There were no vitals taken for this visit. Physical Exam Gen: NAD, alert, cooperative with exam, well-appearing MSK:  ***      ASSESSMENT & PLAN:   No problem-specific Assessment & Plan notes found for this encounter.

## 2021-07-06 IMAGING — DX DG KNEE COMPLETE 4+V*L*
3 series · 3 of 3 positions shown · non-contrast
Comparison: None.

CLINICAL DATA: Fall with left knee pain.

EXAM:
LEFT KNEE - COMPLETE 4+ VIEW

[knee ap]
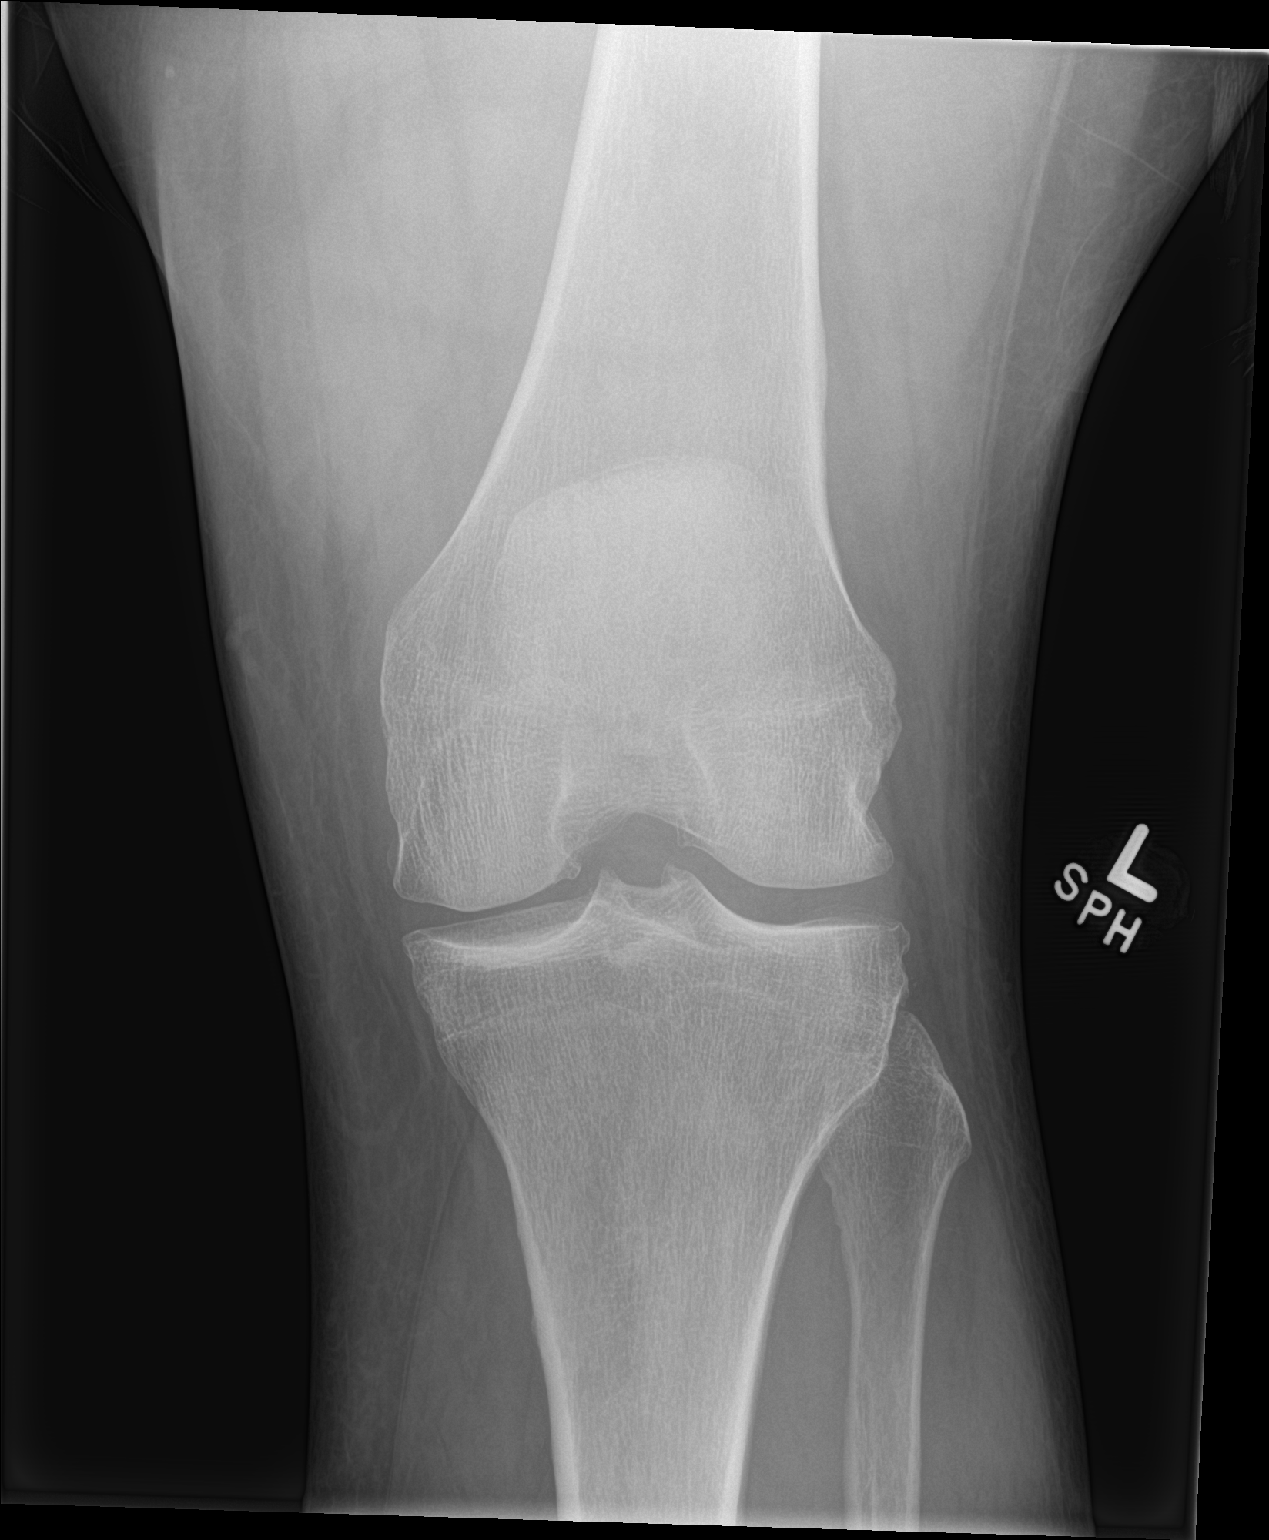

[knee tunnel]
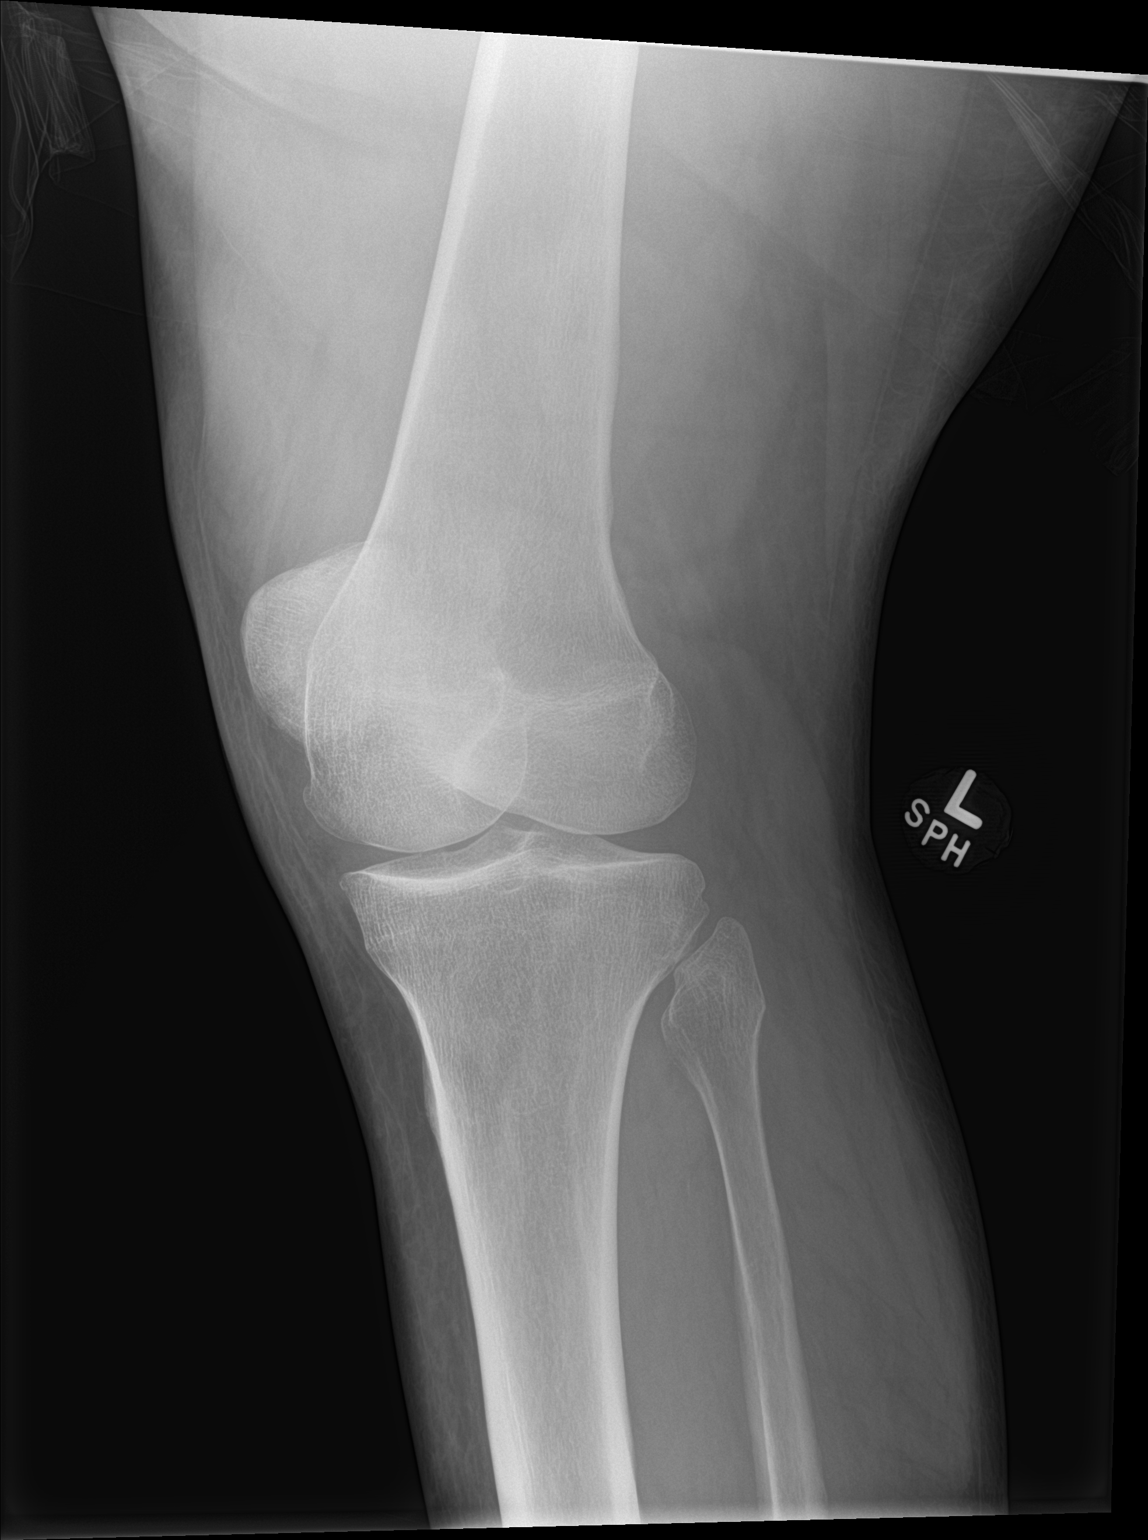

[knee obl]
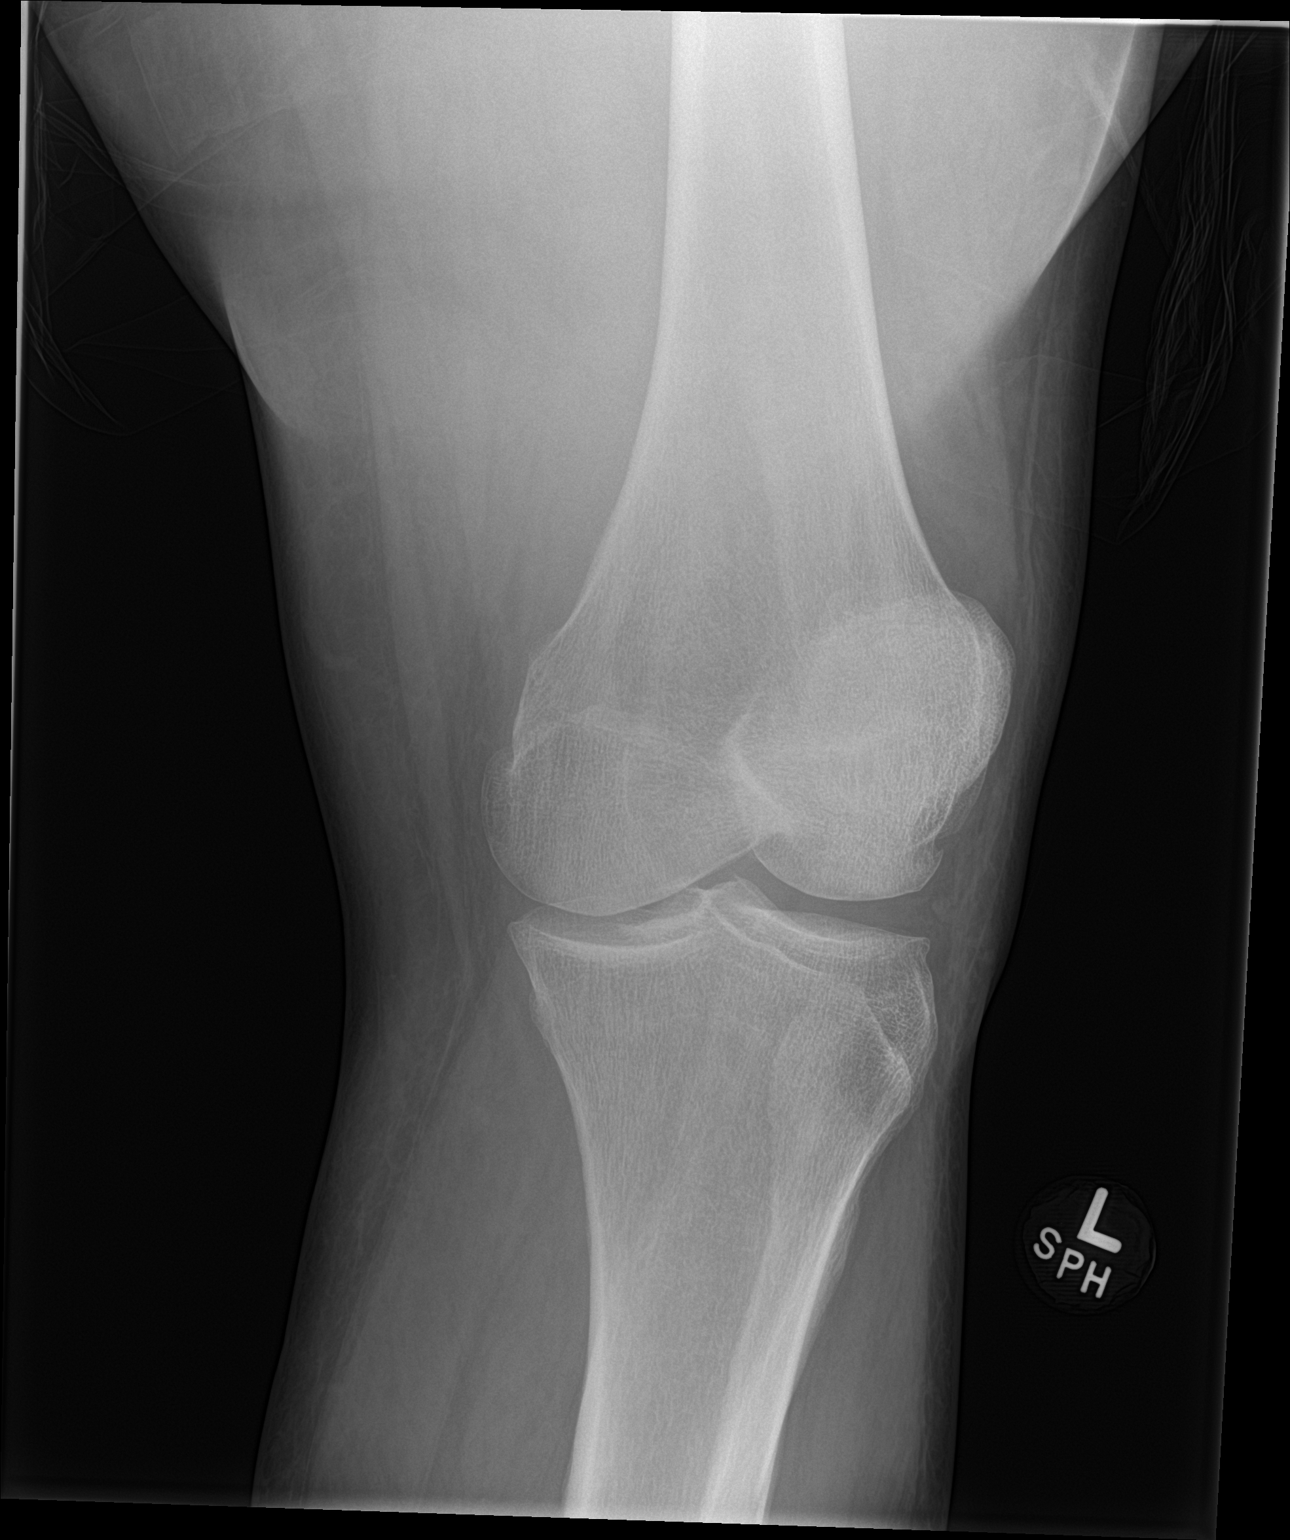

[3 of 3 positions shown; findings below may reference images not displayed]

FINDINGS: Minimal tricompartmental osteoarthritic change. No evidence of acute
fracture or dislocation. No significant joint effusion.
IMPRESSION: 1. No acute findings.
2. Minimal osteoarthritic change.

## 2021-10-21 ENCOUNTER — Emergency Department (HOSPITAL_BASED_OUTPATIENT_CLINIC_OR_DEPARTMENT_OTHER)
Admission: EM | Admit: 2021-10-21 | Discharge: 2021-10-21 | Disposition: A | Discharge status: Home / Self Care | Payer: BC Managed Care – PPO | Attending: Emergency Medicine | Admitting: Emergency Medicine

## 2021-10-21 ENCOUNTER — Other Ambulatory Visit: Payer: Self-pay

## 2021-10-21 ENCOUNTER — Emergency Department (HOSPITAL_BASED_OUTPATIENT_CLINIC_OR_DEPARTMENT_OTHER): Payer: BC Managed Care – PPO

## 2021-10-21 ENCOUNTER — Encounter (HOSPITAL_BASED_OUTPATIENT_CLINIC_OR_DEPARTMENT_OTHER): Payer: Self-pay

## 2021-10-21 DIAGNOSIS — J209 Acute bronchitis, unspecified: Secondary | ICD-10-CM | POA: Diagnosis not present

## 2021-10-21 DIAGNOSIS — E119 Type 2 diabetes mellitus without complications: Secondary | ICD-10-CM | POA: Insufficient documentation

## 2021-10-21 DIAGNOSIS — R059 Cough, unspecified: Secondary | ICD-10-CM | POA: Diagnosis present

## 2021-10-21 DIAGNOSIS — J45909 Unspecified asthma, uncomplicated: Secondary | ICD-10-CM | POA: Insufficient documentation

## 2021-10-21 MED ORDER — PREDNISONE 20 MG PO TABS
40.0000 mg | ORAL_TABLET | Freq: Once | ORAL | Status: AC
  Administered 2021-10-21: 40 mg via ORAL
  Filled 2021-10-21: qty 2

## 2021-10-21 MED ORDER — ALBUTEROL SULFATE HFA 108 (90 BASE) MCG/ACT IN AERS
2.0000 | INHALATION_SPRAY | RESPIRATORY_TRACT | Status: DC | PRN
  Administered 2021-10-21: 2 via RESPIRATORY_TRACT
  Filled 2021-10-21: qty 6.7

## 2021-10-21 MED ORDER — IPRATROPIUM-ALBUTEROL 0.5-2.5 (3) MG/3ML IN SOLN
3.0000 mL | Freq: Once | RESPIRATORY_TRACT | Status: AC
  Administered 2021-10-21: 3 mL via RESPIRATORY_TRACT
  Filled 2021-10-21: qty 3

## 2021-10-21 MED ORDER — PREDNISONE 20 MG PO TABS: ORAL_TABLET | ORAL | 0 refills | Status: AC

## 2021-10-21 NOTE — Discharge Instructions (Signed)
Use the albuterol inhaler every 4 hours as needed to help with your cough or shortness of breath.  When you use it, use 2 puffs at a time. ?

## 2021-10-21 NOTE — ED Provider Notes (Signed)
?[facility] EMERGENCY DEPARTMENT ?Provider Note ? ? ?CSN: [id number] ?Arrival date & time: 10/21/21  0554 ? ?  ? ?History ? ?Chief Complaint  ?Patient presents with  ? Cough  ? Nasal Congestion  ? ? ?Benjamin Irwin is a 46 y.o. male. ? ?The history is provided by the patient.  ?Cough ?He has history of diabetes, asthma and comes in with a 1 week history of cough productive of yellow sputum with associated scratchy throat and yellow rhinorrhea.  He denies fever, chills, sweats.  There has been some slight dyspnea.  There has been no nausea or vomiting.  He denies arthralgias or myalgias.  He denies any sick contacts.  He has tried some natural remedies which have not been helpful. ?  ?Home Medications ?Prior to Admission medications   ?Medication Sig Start Date End Date Taking? Authorizing Provider  ?docusate sodium (COLACE) 100 MG capsule Take 1 capsule (100 mg total) by mouth every 12 (twelve) hours. 06/23/17   Khatri, Hina, PA-C  ?doxycycline (VIBRA-TABS) 100 MG tablet Take 1 tablet (100 mg total) by mouth 2 (two) times daily. 07/18/20   Elson Areas, PA-C  ?   ? ?Allergies    ?Fish allergy and Shellfish allergy   ? ?Review of Systems   ?Review of Systems  ?Respiratory:  Positive for cough.   ?All other systems reviewed and are negative. ? ?Physical Exam ?Updated Vital Signs ?BP (!) 152/95 (BP Location: Right Arm)   Pulse 99   Temp 98.4 ?F (36.9 ?C) (Oral)   Resp 18   Ht 6' (1.829 m)   Wt 134.3 kg   SpO2 99%   BMI 40.14 kg/m?  ?Physical Exam ?Vitals and nursing note reviewed.  ?46 year old male, resting comfortably and in no acute distress. Vital signs are significant for elevated blood pressure. Oxygen saturation is 99%, which is normal. ?Head is normocephalic and atraumatic. PERRLA, EOMI. Oropharynx is clear. ?Neck is nontender and supple without adenopathy or JVD. ?Back is nontender and there is no CVA tenderness. ?Lungs are clear without rales, wheezes, or rhonchi.  Slight wheeze is noted  with forced exhalation. ?Chest is nontender. ?Heart has regular rate and rhythm without murmur. ?Abdomen is soft, flat, nontender without masses or hepatosplenomegaly and peristalsis is normoactive. ?Extremities have no cyanosis or edema, full range of motion is present. ?Skin is warm and dry without rash. ?Neurologic: Mental status is normal, cranial nerves are intact, there are no motor or sensory deficits. ? ?ED Results / Procedures / Treatments   ? ?Radiology ?DG Chest 2 View ? ?Result Date: 10/21/2021 ?CLINICAL DATA:  Cough with history of asthma, diabetes. EXAM: CHEST - 2 VIEW COMPARISON:  PA Lat 09/02/2015. FINDINGS: The heart size and mediastinal contours are within normal limits. Both lungs are clear. The visualized skeletal structures are unremarkable. There are small eventrations along both hemidiaphragms. IMPRESSION: No active cardiopulmonary disease.  Stable chest. Electronically Signed   By: Almira Bar M.D.   On: 10/21/2021 06:30   ? ?Procedures ?Procedures  ? ? ?Medications Ordered in ED ?Medications  ?ipratropium-albuterol (DUONEB) 0.5-2.5 (3) MG/3ML nebulizer solution 3 mL (has no administration in time range)  ? ? ?ED Course/ Medical Decision Making/ A&P ?  ?                        ?Medical Decision Making ?Amount and/or Complexity of Data Reviewed ?Radiology: ordered. ? ?Risk ?Prescription drug management. ? ? ?Respiratory tract infection, probably  viral.  Will get chest x-ray to rule out pneumonia.  Differential diagnosis includes bronchitis, allergic rhinitis with postnasal drip.  He will be given a therapeutic trial of albuterol with ipratropium.  Old records are reviewed, and he has no relevant past visits. ? ?Chest x-ray shows no evidence of pneumonia.  I have independently viewed the images, and agree with the radiologist's interpretation.  He did notice significant clinical improvement with nebulizer treatment with albuterol and ipratropium.  He is given a dose of prednisone and  discharged with a prescription for prednisone.  Also given an albuterol inhaler to take home. ? ?Final Clinical Impression(s) / ED Diagnoses ?Final diagnoses:  ?Acute bronchitis with bronchospasm  ? ? ?Rx / DC Orders ?ED Discharge Orders   ? ?      Ordered  ?  predniSONE (DELTASONE) 20 MG tablet       ? 10/21/21 2979  ? ?  ?  ? ?  ? ? ?  ?Dione Booze, MD ?10/21/21 8921 ? ?

## 2022-10-08 ENCOUNTER — Encounter: Payer: Self-pay | Admitting: *Deleted
